# Patient Record
Sex: Female | Born: 1935 | Race: White | Hispanic: No | Smoking: Never smoker
Health system: Southern US, Community
[De-identification: ages and names within clinical notes are randomized; demographics above are authoritative.]

## PROBLEM LIST (undated history)

## (undated) DIAGNOSIS — M1990 Unspecified osteoarthritis, unspecified site: Secondary | ICD-10-CM

## (undated) DIAGNOSIS — G459 Transient cerebral ischemic attack, unspecified: Secondary | ICD-10-CM

## (undated) DIAGNOSIS — E7800 Pure hypercholesterolemia, unspecified: Secondary | ICD-10-CM

## (undated) HISTORY — DX: Transient cerebral ischemic attack, unspecified: G45.9

## (undated) HISTORY — DX: Unspecified osteoarthritis, unspecified site: M19.90

## (undated) HISTORY — PX: EYE SURGERY: SHX253

## (undated) HISTORY — PX: KNEE SURGERY: SHX244

## (undated) HISTORY — PX: APPENDECTOMY: SHX54

## (undated) HISTORY — DX: Pure hypercholesterolemia, unspecified: E78.00

---

## 2004-07-21 ENCOUNTER — Other Ambulatory Visit: Payer: Self-pay

## 2004-08-21 ENCOUNTER — Encounter: Payer: Self-pay | Admitting: Internal Medicine

## 2007-01-03 ENCOUNTER — Emergency Department: Payer: Self-pay

## 2007-01-03 ENCOUNTER — Other Ambulatory Visit: Payer: Self-pay

## 2007-01-05 ENCOUNTER — Ambulatory Visit: Payer: Self-pay | Admitting: Internal Medicine

## 2007-02-22 ENCOUNTER — Ambulatory Visit: Payer: Self-pay | Admitting: Internal Medicine

## 2007-03-14 ENCOUNTER — Emergency Department: Payer: Self-pay | Admitting: Emergency Medicine

## 2007-07-25 ENCOUNTER — Ambulatory Visit: Payer: Self-pay | Admitting: Gastroenterology

## 2007-09-19 ENCOUNTER — Ambulatory Visit: Payer: Self-pay | Admitting: Internal Medicine

## 2008-03-20 ENCOUNTER — Ambulatory Visit: Payer: Self-pay | Admitting: Internal Medicine

## 2008-09-15 ENCOUNTER — Ambulatory Visit: Payer: Self-pay | Admitting: Internal Medicine

## 2009-02-03 ENCOUNTER — Emergency Department: Payer: Self-pay | Admitting: Emergency Medicine

## 2009-09-15 ENCOUNTER — Ambulatory Visit: Payer: Self-pay | Admitting: Internal Medicine

## 2011-05-29 ENCOUNTER — Emergency Department: Payer: Self-pay | Admitting: Emergency Medicine

## 2011-06-07 ENCOUNTER — Emergency Department: Payer: Self-pay | Admitting: Emergency Medicine

## 2013-02-25 ENCOUNTER — Encounter: Payer: Self-pay | Admitting: Internal Medicine

## 2013-02-25 ENCOUNTER — Ambulatory Visit (INDEPENDENT_AMBULATORY_CARE_PROVIDER_SITE_OTHER): Payer: Medicare Other | Admitting: Internal Medicine

## 2013-02-25 VITALS — BP 132/72 | HR 90 | Temp 97.9°F | Resp 18 | Ht 62.0 in | Wt 120.2 lb

## 2013-02-25 DIAGNOSIS — G8929 Other chronic pain: Secondary | ICD-10-CM

## 2013-02-25 DIAGNOSIS — G459 Transient cerebral ischemic attack, unspecified: Secondary | ICD-10-CM

## 2013-02-25 DIAGNOSIS — R350 Frequency of micturition: Secondary | ICD-10-CM

## 2013-02-25 DIAGNOSIS — R3989 Other symptoms and signs involving the genitourinary system: Secondary | ICD-10-CM

## 2013-02-25 DIAGNOSIS — K5900 Constipation, unspecified: Secondary | ICD-10-CM

## 2013-02-25 DIAGNOSIS — R82998 Other abnormal findings in urine: Secondary | ICD-10-CM

## 2013-02-25 DIAGNOSIS — M549 Dorsalgia, unspecified: Secondary | ICD-10-CM

## 2013-02-25 DIAGNOSIS — E7800 Pure hypercholesterolemia, unspecified: Secondary | ICD-10-CM

## 2013-02-26 ENCOUNTER — Ambulatory Visit: Payer: Medicare Other

## 2013-02-26 DIAGNOSIS — R350 Frequency of micturition: Secondary | ICD-10-CM

## 2013-02-26 LAB — URINALYSIS, ROUTINE W REFLEX MICROSCOPIC
Bilirubin Urine: NEGATIVE
Total Protein, Urine: NEGATIVE
Urine Glucose: NEGATIVE

## 2013-02-27 LAB — URINE CULTURE

## 2013-02-28 ENCOUNTER — Other Ambulatory Visit: Payer: Self-pay | Admitting: Internal Medicine

## 2013-02-28 DIAGNOSIS — R319 Hematuria, unspecified: Secondary | ICD-10-CM

## 2013-02-28 NOTE — Progress Notes (Signed)
Orders placed for f/u labs.  

## 2013-03-04 ENCOUNTER — Telehealth: Payer: Self-pay | Admitting: Internal Medicine

## 2013-03-04 ENCOUNTER — Encounter: Payer: Self-pay | Admitting: Internal Medicine

## 2013-03-04 DIAGNOSIS — M549 Dorsalgia, unspecified: Secondary | ICD-10-CM | POA: Insufficient documentation

## 2013-03-04 DIAGNOSIS — G459 Transient cerebral ischemic attack, unspecified: Secondary | ICD-10-CM | POA: Insufficient documentation

## 2013-03-04 DIAGNOSIS — E7800 Pure hypercholesterolemia, unspecified: Secondary | ICD-10-CM | POA: Insufficient documentation

## 2013-03-04 NOTE — Assessment & Plan Note (Signed)
On plavix.  No reoccurring symptoms.  Follow. Obtain records.

## 2013-03-04 NOTE — Assessment & Plan Note (Signed)
Takes tramadol.  Obtain records.  Stable.

## 2013-03-04 NOTE — Progress Notes (Signed)
Subjective:    Patient ID: Tricia Ruiz, female    DOB: [birth year], 79 y.o.   MRN: [id number]  HPI 79 year old female with past history of hypercholesterolemia and TIA who comes in today to establish care.  She has previously been seeing Dr Dareen Piano.  She informs me that she lives with her daughter-n-law (Vickie).  Her daughter lives in the basement of her house as well.  States she tries to stay active.  Has had some issues with back pain.  Takes tramadol prn.  Has not pain with sitting.  Some constipation. Dulcolax works.  No increased sinus or allergy symptoms.  No cardiac symptoms with increased activity or exertion.  Her main complaint is that of some puffiness and swelling in the right side of her vagina.  Itches.  No vaginal discharge.  No abdomina pain.  Has noticed her urine is more brown.     Past Medical History  Diagnosis Date  . Arthritis   . Hypercholesterolemia   . TIA (transient ischemic attack)     Outpatient Encounter Prescriptions as of 02/25/2013  Medication Sig Dispense Refill  . clopidogrel (PLAVIX) 75 MG tablet Take 75 mg by mouth daily.      . simvastatin (ZOCOR) 40 MG tablet Take 40 mg by mouth daily.      . traMADol (ULTRAM) 50 MG tablet Take 50 mg by mouth every 6 (six) hours as needed for pain.       No facility-administered encounter medications on file as of 02/25/2013.    Review of Systems Patient denies any headache, lightheadedness or dizziness.  No sinus or allergy symptoms.  No chest pain, tightness or palpitations.  No increased shortness of breath, cough or congestion.  No acid reflux.  No nausea or vomiting.  No abdominal pain or cramping.  No bowel change, such as diarrhea, BRBPR or melana.  Some issues with constipation.  Dulcolax works for her.  Does report the vaginal burning and swelling as outlined.  No discharge.  Urine brown.       Objective:   Physical Exam Filed Vitals:   02/25/13 1429  BP: 132/72  Pulse: 90  Temp: 97.9 F (36.6 C)   Resp: 76   79 year old female in no acute distress.   HEENT:  Nares- clear.  Oropharynx - without lesions. NECK:  Supple.  Nontender.  No audible bruit.  HEART:  Appears to be regular. LUNGS:  No crackles or wheezing audible.  Respirations even and unlabored.  RADIAL PULSE:  Equal bilaterally. ABDOMEN:  Soft, nontender.  Bowel sounds present and normal.  No audible abdominal bruit.  GU:  Normal external genitalia except for some increased erythema and minimal soft tissue swelling right labia.  Appears to be c/w yeast infection.   EXTREMITIES:  No increased edema present.  DP pulses palpable and equal bilaterally.      SKIN:  Without rash.      Assessment & Plan:  GYN.  Vaginal swelling, rash and itching as outlined.  Exam as outlined.  Treat with nystatin cream as directed.  Follow.  Get her back in soon to reassess.    CONSTIPATION.  Dulcolax works.  Follow.    INCREASED PSYCHOSOCIAL STRESSORS.  Discussed some stress with her living situation.  Daughter lives in her basement.  Some increased stress related to this.  Feels she is handling things relatively well.  Follow.    GU.  With the notice of change in color of her urine, will  check urinalysis to confirm no infection.  Stay hydrated.   HEALTH MAINTENANCE.  Schedule her for a physical - next visit.  Obtain outside records to review.   I spent 45 minutes with this patient and more than 50% of the time was spent in consultation regarding the above.

## 2013-03-04 NOTE — Telephone Encounter (Signed)
Was supposed to have scheduled labs prior to leaving.  Did not get scheduled.  Please schedule labs within one week.  Thanks.

## 2013-03-04 NOTE — Assessment & Plan Note (Signed)
On simvastatin.  Check lipid panel and liver function.   

## 2013-03-05 NOTE — Telephone Encounter (Signed)
Appointment 4/16 pt aware

## 2013-03-06 ENCOUNTER — Other Ambulatory Visit (INDEPENDENT_AMBULATORY_CARE_PROVIDER_SITE_OTHER): Payer: Medicare Other

## 2013-03-06 DIAGNOSIS — R319 Hematuria, unspecified: Secondary | ICD-10-CM

## 2013-03-06 DIAGNOSIS — G459 Transient cerebral ischemic attack, unspecified: Secondary | ICD-10-CM

## 2013-03-06 DIAGNOSIS — K5900 Constipation, unspecified: Secondary | ICD-10-CM

## 2013-03-06 DIAGNOSIS — E7800 Pure hypercholesterolemia, unspecified: Secondary | ICD-10-CM

## 2013-03-06 LAB — COMPREHENSIVE METABOLIC PANEL
ALT: 13 U/L (ref 0–35)
AST: 21 U/L (ref 0–37)
Alkaline Phosphatase: 34 U/L — ABNORMAL LOW (ref 39–117)
Glucose, Bld: 101 mg/dL — ABNORMAL HIGH (ref 70–99)
Sodium: 138 mEq/L (ref 135–145)
Total Bilirubin: 0.6 mg/dL (ref 0.3–1.2)
Total Protein: 6.9 g/dL (ref 6.0–8.3)

## 2013-03-06 LAB — CBC WITH DIFFERENTIAL/PLATELET
Basophils Relative: 0.2 % (ref 0.0–3.0)
Eosinophils Relative: 3.3 % (ref 0.0–5.0)
HCT: 38.2 % (ref 36.0–46.0)
Lymphs Abs: 1.8 10*3/uL (ref 0.7–4.0)
MCV: 89 fl (ref 78.0–100.0)
Monocytes Absolute: 0.6 10*3/uL (ref 0.1–1.0)
Platelets: 240 10*3/uL (ref 150.0–400.0)
WBC: 7.2 10*3/uL (ref 4.5–10.5)

## 2013-03-06 LAB — URINALYSIS, ROUTINE W REFLEX MICROSCOPIC
Ketones, ur: NEGATIVE
Specific Gravity, Urine: 1.025 (ref 1.000–1.030)
Total Protein, Urine: NEGATIVE
Urine Glucose: NEGATIVE
pH: 5.5 (ref 5.0–8.0)

## 2013-03-06 LAB — LIPID PANEL
LDL Cholesterol: 64 mg/dL (ref 0–99)
VLDL: 27 mg/dL (ref 0.0–40.0)

## 2013-03-06 LAB — TSH: TSH: 3.71 u[IU]/mL (ref 0.35–5.50)

## 2013-03-09 LAB — CULTURE, URINE COMPREHENSIVE: Colony Count: 50000

## 2013-03-15 ENCOUNTER — Telehealth: Payer: Self-pay | Admitting: Internal Medicine

## 2013-03-15 MED ORDER — AMOXICILLIN 875 MG PO TABS: 875.0000 mg | ORAL_TABLET | Freq: Two times a day (BID) | ORAL | Status: AC

## 2013-03-15 NOTE — Telephone Encounter (Signed)
Called in Amoxicillin 875mg  bid x 7days to [facility] & pt was informed

## 2013-03-15 NOTE — Telephone Encounter (Signed)
Pt called checking on her meds for an uti  She stated she was in last week and she just checked with [facility] and the rx was not there Please advise

## 2013-04-24 ENCOUNTER — Ambulatory Visit (INDEPENDENT_AMBULATORY_CARE_PROVIDER_SITE_OTHER): Payer: Medicare Other | Admitting: Internal Medicine

## 2013-04-24 ENCOUNTER — Encounter: Payer: Self-pay | Admitting: Internal Medicine

## 2013-04-24 VITALS — BP 138/80 | HR 82 | Temp 98.5°F | Ht 62.0 in | Wt 117.5 lb

## 2013-04-24 DIAGNOSIS — R443 Hallucinations, unspecified: Secondary | ICD-10-CM

## 2013-04-24 DIAGNOSIS — M549 Dorsalgia, unspecified: Secondary | ICD-10-CM

## 2013-04-24 DIAGNOSIS — R440 Auditory hallucinations: Secondary | ICD-10-CM

## 2013-04-24 DIAGNOSIS — G8929 Other chronic pain: Secondary | ICD-10-CM

## 2013-04-24 DIAGNOSIS — E7800 Pure hypercholesterolemia, unspecified: Secondary | ICD-10-CM

## 2013-04-24 DIAGNOSIS — L989 Disorder of the skin and subcutaneous tissue, unspecified: Secondary | ICD-10-CM

## 2013-04-24 DIAGNOSIS — G459 Transient cerebral ischemic attack, unspecified: Secondary | ICD-10-CM

## 2013-04-28 ENCOUNTER — Encounter: Payer: Self-pay | Admitting: Internal Medicine

## 2013-04-28 DIAGNOSIS — R440 Auditory hallucinations: Secondary | ICD-10-CM | POA: Insufficient documentation

## 2013-04-28 NOTE — Progress Notes (Signed)
Subjective:    Patient ID: Tricia Ruiz, female    DOB: Feb 22, 1918, 79 y.o.   MRN: [id number]  HPI 79 year old female with past history of hypercholesterolemia and TIA who comes in today for a scheduled follow up.  She lives with her daughter-n-law (Tricia Ruiz).  Her daughter lives in the basement of her house as well.  Tricia Ruiz accompanies her to this visit.  History obtained from both of them.  States she tries to stay active.  Has had some issues with back pain.  Takes tramadol prn.  Has not pain with sitting.  Some constipation.  Dulcolax has worked for her previously.  Did not feel miralax helped.  No increased sinus or allergy symptoms.  No cardiac symptoms with increased activity or exertion.  She reports some persistent problems with psoriasis of the scalp.  She is using gold bond.  Has seen a dermatologist.  Agrees with referral back to dermatology.  She also has some left facial lesions for them to evaluate.  Reports some intermittent light headedness.  Occurs if she moves quickly or turn fast.  Does not occur at other times.      Past Medical History  Diagnosis Date  . Arthritis   . Hypercholesterolemia   . TIA (transient ischemic attack)     Outpatient Encounter Prescriptions as of 04/24/2013  Medication Sig Dispense Refill  . acetaminophen (TYLENOL) 325 MG tablet Take 650 mg by mouth every 6 (six) hours as needed for pain.      Marland Kitchen clopidogrel (PLAVIX) 75 MG tablet Take 75 mg by mouth daily.      . Cyanocobalamin (B-12 PO) Take by mouth.      . magnesium 30 MG tablet Take 30 mg by mouth daily.      . Pyridoxine HCl (B-6 PO) Take by mouth.      . simvastatin (ZOCOR) 40 MG tablet Take 40 mg by mouth daily.      . traMADol (ULTRAM) 50 MG tablet Take 50 mg by mouth every 6 (six) hours as needed for pain.      Marland Kitchen VITAMIN D, CHOLECALCIFEROL, PO Take by mouth.      . Zinc Sulfate (ZINC 15 PO) Take by mouth.       No facility-administered encounter medications on file as of 04/24/2013.     Review of Systems Patient denies any headache.  Light headedness as outlined.   No sinus or allergy symptoms.  No chest pain, tightness or palpitations.  No increased shortness of breath, cough or congestion.  No acid reflux.  No nausea or vomiting.  No abdominal pain or cramping.  No bowel change, such as diarrhea, BRBPR or melana.  Some issues with constipation.  Dulcolax works for her.  Previously treated for a uti.  Has psoriasis of her scalp.  Itches.  Uses Gold Bond.  Does report hearing singing when no one else hears the singing.  No visual hallucinations.  Some memory change.       Objective:   Physical Exam  Filed Vitals:   04/24/13 1031  BP: 138/80  Pulse: 82  Temp: 98.5 F (30.21 C)   79 year old female in no acute distress.   HEENT:  Nares- clear.  Oropharynx - without lesions. NECK:  Supple.  Nontender.  No audible bruit.  HEART:  Appears to be regular. LUNGS:  No crackles or wheezing audible.  Respirations even and unlabored.  RADIAL PULSE:  Equal bilaterally. ABDOMEN:  Soft, nontender.  Bowel sounds  present and normal.  No audible abdominal bruit.   EXTREMITIES:  No increased edema present.  DP pulses palpable and equal bilaterally.      SKIN:  Rash - scalp.  Facial lesions.        Assessment & Plan:  DERMATOLOGY.  Will refer to dermatology for evaluation of the facial lesions and for evaluation of her scalp.     CONSTIPATION.  Dulcolax works.  Follow.    INCREASED PSYCHOSOCIAL STRESSORS.  Previously iscussed some stress with her living situation.  Daughter lives in her basement.  Some increased stress related to this.  Feels she is handling things relatively well.  Follow.    HEALTH MAINTENANCE.  Schedule her for a physical - next visit.  Obtain outside records to review.

## 2013-04-28 NOTE — Assessment & Plan Note (Signed)
On simvastatin.  Follow lipid panel and liver function.    

## 2013-04-28 NOTE — Assessment & Plan Note (Signed)
On plavix.  No reoccurring symptoms.  Follow.      

## 2013-04-28 NOTE — Assessment & Plan Note (Signed)
Describes hearing singing.  Daughter-n-law denies hearing.  Ongoing issue.  Some memory change.  Will refer to neurology for evaluation and further treatment.  Will hold on head scan.  She prefers to see neurology first.  Follow.

## 2013-04-28 NOTE — Assessment & Plan Note (Signed)
Takes tramadol.  Stable.   

## 2013-06-04 ENCOUNTER — Other Ambulatory Visit: Payer: Self-pay | Admitting: *Deleted

## 2013-06-04 MED ORDER — TRAMADOL HCL 50 MG PO TABS: 50.0000 mg | ORAL_TABLET | Freq: Four times a day (QID) | ORAL | Status: DC | PRN

## 2013-06-04 MED ORDER — SIMVASTATIN 40 MG PO TABS: 40.0000 mg | ORAL_TABLET | Freq: Every day | ORAL | Status: DC

## 2013-06-04 MED ORDER — CLOPIDOGREL BISULFATE 75 MG PO TABS: 75.0000 mg | ORAL_TABLET | Freq: Every day | ORAL | Status: DC

## 2013-06-04 NOTE — Telephone Encounter (Signed)
Refilled tramadol #30 with no refills, plavix #30 with 5 refills and simvastatin #30 with 5 refills

## 2013-06-05 ENCOUNTER — Ambulatory Visit: Payer: Self-pay | Admitting: Neurology

## 2013-06-19 ENCOUNTER — Other Ambulatory Visit: Payer: Self-pay | Admitting: Internal Medicine

## 2013-06-19 MED ORDER — TRAMADOL HCL 50 MG PO TABS: 50.0000 mg | ORAL_TABLET | Freq: Four times a day (QID) | ORAL | Status: DC | PRN

## 2013-06-19 NOTE — Telephone Encounter (Signed)
Okay to refill? 

## 2013-06-19 NOTE — Telephone Encounter (Signed)
Refilled tramadol #60 with no refills.   

## 2013-06-19 NOTE — Telephone Encounter (Signed)
Pt is needing Tramadol refill. Pt is wanting to know if she could change to a 90 day supply.

## 2013-07-03 ENCOUNTER — Ambulatory Visit (INDEPENDENT_AMBULATORY_CARE_PROVIDER_SITE_OTHER): Payer: Medicare Other | Admitting: Internal Medicine

## 2013-07-03 ENCOUNTER — Encounter: Payer: Self-pay | Admitting: Internal Medicine

## 2013-07-03 ENCOUNTER — Ambulatory Visit (INDEPENDENT_AMBULATORY_CARE_PROVIDER_SITE_OTHER)
Admission: RE | Admit: 2013-07-03 | Discharge: 2013-07-03 | Disposition: A | Discharge status: Home / Self Care | Payer: Medicare Other | Source: Ambulatory Visit | Attending: Internal Medicine | Admitting: Internal Medicine

## 2013-07-03 VITALS — BP 130/60 | HR 80 | Temp 98.1°F | Ht 62.0 in | Wt 116.5 lb

## 2013-07-03 DIAGNOSIS — M549 Dorsalgia, unspecified: Secondary | ICD-10-CM

## 2013-07-03 DIAGNOSIS — E7800 Pure hypercholesterolemia, unspecified: Secondary | ICD-10-CM

## 2013-07-03 DIAGNOSIS — R443 Hallucinations, unspecified: Secondary | ICD-10-CM

## 2013-07-03 DIAGNOSIS — G8929 Other chronic pain: Secondary | ICD-10-CM

## 2013-07-03 DIAGNOSIS — R440 Auditory hallucinations: Secondary | ICD-10-CM

## 2013-07-03 DIAGNOSIS — G459 Transient cerebral ischemic attack, unspecified: Secondary | ICD-10-CM

## 2013-07-05 ENCOUNTER — Encounter: Payer: Self-pay | Admitting: Internal Medicine

## 2013-07-05 ENCOUNTER — Other Ambulatory Visit (INDEPENDENT_AMBULATORY_CARE_PROVIDER_SITE_OTHER): Payer: Medicare Other

## 2013-07-05 DIAGNOSIS — M549 Dorsalgia, unspecified: Secondary | ICD-10-CM

## 2013-07-05 DIAGNOSIS — E7800 Pure hypercholesterolemia, unspecified: Secondary | ICD-10-CM

## 2013-07-05 LAB — BASIC METABOLIC PANEL
BUN: 16 mg/dL (ref 6–23)
Calcium: 9.3 mg/dL (ref 8.4–10.5)
Creatinine, Ser: 0.8 mg/dL (ref 0.4–1.2)
GFR: 76.55 mL/min (ref >60.00)
Glucose, Bld: 85 mg/dL (ref 70–99)
Potassium: 4 mEq/L (ref 3.5–5.1)

## 2013-07-05 LAB — HEPATIC FUNCTION PANEL
AST: 20 U/L (ref 0–37)
Albumin: 3.9 g/dL (ref 3.5–5.2)
Total Bilirubin: 1 mg/dL (ref 0.3–1.2)

## 2013-07-05 LAB — LIPID PANEL
Cholesterol: 140 mg/dL (ref 0–200)
HDL: 55.2 mg/dL (ref >39.00)
LDL Cholesterol: 69 mg/dL (ref 0–99)
VLDL: 15.8 mg/dL (ref 0.0–40.0)

## 2013-07-05 MED ORDER — NYSTATIN 100000 UNIT/GM EX CREA: TOPICAL_CREAM | Freq: Two times a day (BID) | CUTANEOUS | Status: DC

## 2013-07-05 NOTE — Assessment & Plan Note (Signed)
On plavix.  No reoccurring symptoms.  Follow.      

## 2013-07-05 NOTE — Assessment & Plan Note (Signed)
On simvastatin.  Follow lipid panel and liver function.    

## 2013-07-05 NOTE — Assessment & Plan Note (Signed)
Takes tramadol.  Persistent pain.  Will check L-S spine xray.  Discussed the possibility of home physical therapy.

## 2013-07-05 NOTE — Assessment & Plan Note (Signed)
Evaluated by Dr Sherryll Burger.  Had EEG and MRI.  Obtain results.

## 2013-07-05 NOTE — Progress Notes (Signed)
Subjective:    Patient ID: Tricia Ruiz, female    DOB: [birth year], 79 y.o.   MRN: [id number]  HPI 79 year old female with past history of hypercholesterolemia and TIA who comes in today for a scheduled follow up.  She lives with her daughter-n-law (Vickie).  Her daughter lives in the basement of her house as well.  Vickie accompanies her to this visit.  History obtained from both of them.  States she tries to stay active.  Has had some issues with back pain.  Takes tramadol 2-3x/day.  Discussed further w/up.    No increased sinus or allergy symptoms.  No cardiac symptoms with increased activity or exertion.  Eating and drinking well.  Bowels stable.  No abdominal pain or cramping.      Past Medical History  Diagnosis Date  . Arthritis   . Hypercholesterolemia   . TIA (transient ischemic attack)     Outpatient Encounter Prescriptions as of 07/03/2013  Medication Sig Dispense Refill  . acetaminophen (TYLENOL) 325 MG tablet Take 650 mg by mouth every 6 (six) hours as needed for pain.      Marland Kitchen clopidogrel (PLAVIX) 75 MG tablet Take 1 tablet (75 mg total) by mouth daily.  30 tablet  5  . Cyanocobalamin (B-12 PO) Take by mouth.      . magnesium 30 MG tablet Take 30 mg by mouth daily.      . Pyridoxine HCl (B-6 PO) Take by mouth.      . simvastatin (ZOCOR) 40 MG tablet Take 1 tablet (40 mg total) by mouth daily.  30 tablet  5  . traMADol (ULTRAM) 50 MG tablet Take 1 tablet (50 mg total) by mouth every 6 (six) hours as needed for pain.  60 tablet  0  . VITAMIN D, CHOLECALCIFEROL, PO Take by mouth.      . Zinc Sulfate (ZINC 15 PO) Take by mouth.       No facility-administered encounter medications on file as of 07/03/2013.    Review of Systems Patient denies any headache.   No sinus or allergy symptoms.  No chest pain, tightness or palpitations.  No increased shortness of breath, cough or congestion.  No acid reflux.  No nausea or vomiting.  No abdominal pain or cramping.  No bowel change, such  as diarrhea, BRBPR or melana.  Back pain as outlined.  Takes tramadol.  Hears the singing.  See last note for details.  She Dr Margaretmary Eddy note for details.  Had EEG and MRI.  Need results.  Reports perivaginal irritation.      Objective:   Physical Exam  Filed Vitals:   07/03/13 1108  BP: 130/60  Pulse: 80  Temp: 98.1 F (77.12 C)   79 year old female in no acute distress.   HEENT:  Nares- clear.  Oropharynx - without lesions. NECK:  Supple.  Nontender.  No audible bruit.  HEART:  Appears to be regular. LUNGS:  No crackles or wheezing audible.  Respirations even and unlabored.  RADIAL PULSE:  Equal bilaterally. BREASTS:  No nipple discharge or nipple retraction present.  Could not appreciate any distinct nodules or axillary adenopathy..   ABDOMEN:  Soft, nontender.  Bowel sounds present and normal.  No audible abdominal bruit.  GU:  Increased erythema - perivaginal area.    EXTREMITIES:  No increased edema present.  DP pulses palpable and equal bilaterally.           Assessment & Plan:  DERMATOLOGY.  Perivaginal  irritation.  Keep area as dry as possible.  Nystatin cream.   Follow.   CONSTIPATION.  Dulcolax works.  Follow.    INCREASED PSYCHOSOCIAL STRESSORS.  Previously iscussed some stress with her living situation.  Daughter lives in her basement.  Some increased stress related to this.  Feels she is handling things relatively well.  Follow.    HEALTH MAINTENANCE.  Physical today.  Declines mammogram.

## 2013-07-08 ENCOUNTER — Encounter: Payer: Self-pay | Admitting: *Deleted

## 2013-07-12 ENCOUNTER — Other Ambulatory Visit: Payer: Self-pay | Admitting: *Deleted

## 2013-07-12 ENCOUNTER — Encounter: Payer: Medicare Other | Admitting: Internal Medicine

## 2013-07-12 NOTE — Telephone Encounter (Signed)
Okay to fill? 

## 2013-07-14 MED ORDER — TRAMADOL HCL 50 MG PO TABS: 50.0000 mg | ORAL_TABLET | Freq: Four times a day (QID) | ORAL | Status: DC | PRN

## 2013-07-14 NOTE — Telephone Encounter (Signed)
Refilled tramodol #90 with one refill

## 2013-07-15 ENCOUNTER — Encounter: Payer: Self-pay | Admitting: Internal Medicine

## 2013-08-29 ENCOUNTER — Other Ambulatory Visit: Payer: Self-pay | Admitting: *Deleted

## 2013-08-29 NOTE — Telephone Encounter (Signed)
Refill

## 2013-08-30 MED ORDER — TRAMADOL HCL 50 MG PO TABS: 50.0000 mg | ORAL_TABLET | Freq: Four times a day (QID) | ORAL | Status: DC | PRN

## 2013-08-30 NOTE — Telephone Encounter (Signed)
Rx faxed to [facility] 

## 2013-08-30 NOTE — Telephone Encounter (Signed)
Refilled tramadol #90 with one refill.

## 2013-11-06 ENCOUNTER — Ambulatory Visit (INDEPENDENT_AMBULATORY_CARE_PROVIDER_SITE_OTHER): Payer: Medicare Other | Admitting: Internal Medicine

## 2013-11-06 ENCOUNTER — Other Ambulatory Visit: Payer: Self-pay | Admitting: Internal Medicine

## 2013-11-06 ENCOUNTER — Encounter: Payer: Self-pay | Admitting: Internal Medicine

## 2013-11-06 VITALS — BP 110/70 | HR 74 | Temp 97.8°F | Ht 62.0 in | Wt 119.5 lb

## 2013-11-06 DIAGNOSIS — R443 Hallucinations, unspecified: Secondary | ICD-10-CM

## 2013-11-06 DIAGNOSIS — R440 Auditory hallucinations: Secondary | ICD-10-CM

## 2013-11-06 DIAGNOSIS — E7800 Pure hypercholesterolemia, unspecified: Secondary | ICD-10-CM

## 2013-11-06 DIAGNOSIS — M549 Dorsalgia, unspecified: Secondary | ICD-10-CM

## 2013-11-06 DIAGNOSIS — G8929 Other chronic pain: Secondary | ICD-10-CM

## 2013-11-06 DIAGNOSIS — G459 Transient cerebral ischemic attack, unspecified: Secondary | ICD-10-CM

## 2013-11-06 NOTE — Progress Notes (Signed)
Subjective:    Patient ID: Tricia Ruiz, female    DOB: 1918-08-24, 79 y.o.   MRN: [id number]  HPI 79 year old female with past history of hypercholesterolemia and TIA who comes in today for a scheduled follow up.  She is accompanied by her daughter.  History obtained from both of them.  She lives with her daughter.  Her daughter lives in the basement of her house.   States she tries to stay active.  Has had some issues with back pain.  Takes tramadol 2-3x/day.  Seeing Dr Gavin Potters.  Xray revealed compression fracture.  On Fosamax now.  Discussed proper way to take this medication.   No increased sinus or allergy symptoms.  No cardiac symptoms with increased activity or exertion.  Eating and drinking well.  Bowels stable.  No abdominal pain or cramping.      Past Medical History  Diagnosis Date  . Arthritis   . Hypercholesterolemia   . TIA (transient ischemic attack)     Outpatient Encounter Prescriptions as of 11/06/2013  Medication Sig  . aspirin EC 81 MG tablet Take 81 mg by mouth daily.  . Cyanocobalamin (B-12 PO) Take by mouth.  . magnesium 30 MG tablet Take 30 mg by mouth daily.  . naproxen sodium (ANAPROX) 220 MG tablet Take 220 mg by mouth daily.  Marland Kitchen nystatin cream (MYCOSTATIN) Apply topically 2 (two) times daily.  . Pyridoxine HCl (B-6 PO) Take by mouth.  . simvastatin (ZOCOR) 40 MG tablet Take 1 tablet (40 mg total) by mouth daily.  . traMADol (ULTRAM) 50 MG tablet Take 1 tablet (50 mg total) by mouth every 6 (six) hours as needed for pain.  Marland Kitchen VITAMIN D, CHOLECALCIFEROL, PO Take by mouth.  . Zinc Sulfate (ZINC 15 PO) Take by mouth.  . [DISCONTINUED] acetaminophen (TYLENOL) 325 MG tablet Take 650 mg by mouth every 6 (six) hours as needed for pain.  . [DISCONTINUED] clopidogrel (PLAVIX) 75 MG tablet Take 1 tablet (75 mg total) by mouth daily.    Review of Systems Patient denies any headache.   No sinus or allergy symptoms.  No chest pain, tightness or palpitations.  No  increased shortness of breath, cough or congestion.  No acid reflux.  No nausea or vomiting.  No abdominal pain or cramping.  No bowel change, such as diarrhea, BRBPR or melana.  Back pain as outlined.  Takes tramadol.  Has compression fracture. On fosamax now.  Hears the singing.  See previous notes for details.  She Dr Margaretmary Eddy note for details.  Had EEG and MRI.  Overall she feels things are stable.       Objective:   Physical Exam  Filed Vitals:   11/06/13 1041  BP: 110/70  Pulse: 74  Temp: 97.8 F (36.6 C)   Blood pressure recheck:  31/20  79 year old female in no acute distress.   HEENT:  Nares- clear.  Oropharynx - without lesions. NECK:  Supple.  Nontender.  No audible bruit.  HEART:  Appears to be regular. LUNGS:  No crackles or wheezing audible.  Respirations even and unlabored.  RADIAL PULSE:  Equal bilaterally.   ABDOMEN:  Soft, nontender.  Bowel sounds present and normal.  No audible abdominal bruit.    EXTREMITIES:  No increased edema present.  DP pulses palpable and equal bilaterally.           Assessment & Plan:  CONSTIPATION.  Dulcolax works.  Follow.    INCREASED PSYCHOSOCIAL STRESSORS.  Previously  discussed some stress with her living situation.  Daughter lives in her basement.  Some increased stress related to this.  Feels she is handling things relatively well.  Follow.    HEALTH MAINTENANCE.  Physical 07/03/13.  Declines mammogram.    I spent 25 minutes with the patient and more than 50% of the time was spent in consultation regarding the above.

## 2013-11-06 NOTE — Assessment & Plan Note (Signed)
On simvastatin.  Follow lipid panel and liver function.    

## 2013-11-06 NOTE — Progress Notes (Signed)
Pre-visit discussion using our clinic review tool. No additional management support is needed unless otherwise documented below in the visit note.  

## 2013-11-06 NOTE — Assessment & Plan Note (Addendum)
Evaluated by Dr Shah.  Had EEG and MRI.  States stable.  Actually does not hear the singing as often.  Follow.   

## 2013-11-06 NOTE — Assessment & Plan Note (Signed)
On plavix.  No reoccurring symptoms.  Follow.      

## 2013-11-06 NOTE — Progress Notes (Signed)
Orders placed for f/u labs.  

## 2013-11-06 NOTE — Assessment & Plan Note (Addendum)
Takes tramadol.  Persistent pain.  Seeing Dr Gavin Potters.  Has a compression fracture.  Currently pain seems to be under control.  On fosamax.  Started by Dr Gavin Potters.  Discussed proper way to take the medication.  Follow.

## 2013-11-10 ENCOUNTER — Encounter: Payer: Self-pay | Admitting: Internal Medicine

## 2014-01-02 ENCOUNTER — Other Ambulatory Visit: Payer: Self-pay | Admitting: *Deleted

## 2014-01-02 NOTE — Telephone Encounter (Signed)
Ok refill? 

## 2014-01-03 MED ORDER — TRAMADOL HCL 50 MG PO TABS
50.0000 mg | ORAL_TABLET | Freq: Four times a day (QID) | ORAL | Status: DC | PRN
Start: ? — End: 2014-03-25

## 2014-01-03 NOTE — Telephone Encounter (Signed)
Tramadol #90 with one refill - sent in

## 2014-03-12 ENCOUNTER — Encounter: Payer: Self-pay | Admitting: Internal Medicine

## 2014-03-12 ENCOUNTER — Ambulatory Visit (INDEPENDENT_AMBULATORY_CARE_PROVIDER_SITE_OTHER): Payer: Medicare HMO | Admitting: Internal Medicine

## 2014-03-12 VITALS — BP 120/70 | HR 76 | Temp 98.2°F | Ht 62.0 in | Wt 118.5 lb

## 2014-03-12 DIAGNOSIS — T148XXA Other injury of unspecified body region, initial encounter: Secondary | ICD-10-CM

## 2014-03-12 DIAGNOSIS — G459 Transient cerebral ischemic attack, unspecified: Secondary | ICD-10-CM

## 2014-03-12 DIAGNOSIS — R440 Auditory hallucinations: Secondary | ICD-10-CM

## 2014-03-12 DIAGNOSIS — E7800 Pure hypercholesterolemia, unspecified: Secondary | ICD-10-CM

## 2014-03-12 DIAGNOSIS — R21 Rash and other nonspecific skin eruption: Secondary | ICD-10-CM

## 2014-03-12 DIAGNOSIS — G8929 Other chronic pain: Secondary | ICD-10-CM

## 2014-03-12 DIAGNOSIS — R319 Hematuria, unspecified: Secondary | ICD-10-CM

## 2014-03-12 DIAGNOSIS — R443 Hallucinations, unspecified: Secondary | ICD-10-CM

## 2014-03-12 DIAGNOSIS — B351 Tinea unguium: Secondary | ICD-10-CM

## 2014-03-12 DIAGNOSIS — IMO0002 Reserved for concepts with insufficient information to code with codable children: Secondary | ICD-10-CM

## 2014-03-12 DIAGNOSIS — M549 Dorsalgia, unspecified: Secondary | ICD-10-CM

## 2014-03-12 NOTE — Progress Notes (Signed)
Subjective:    Patient ID: Tricia Ruiz, female    DOB: 08/22/1918, 80 y.o.   MRN: [id number]  HPI 80 year old female with past history of hypercholesterolemia and TIA who comes in today for a scheduled follow up.  She is accompanied by her daughter.  History obtained from both of them.  She lives with her daughter.  Her daughter lives in the basement of her house.   States she tries to stay active.  Has had some issues with back pain.  Takes tramadol 2-3x/day.  [facility].  Xray revealed compression fracture.  On Fosamax now.  Discussed proper way to take this medication.   She reports some increased problems with pain and states this limits her activity.  Has to sit and rest if up for a while.  No increased sinus or allergy symptoms.  No cardiac symptoms with increased activity or exertion.  Eating and drinking well.  Bowels stable.  No abdominal pain or cramping.  Has some persistent vaginal irritation and rash. Also has a rash on her wrist.  Would like to see Dr Cheree DittoGraham again.  Has issues with her toenails as well.  Thickened.     Past Medical History  Diagnosis Date  . Arthritis   . Hypercholesterolemia   . TIA (transient ischemic attack)     Outpatient Encounter Prescriptions as of 03/12/2014  Medication Sig  . alendronate (FOSAMAX) 70 MG tablet Take 70 mg by mouth once a week. Take with a full glass of water on an empty stomach.  Marland Kitchen. aspirin EC 81 MG tablet Take 81 mg by mouth daily.  . Cyanocobalamin (B-12 PO) Take by mouth.  . magnesium 30 MG tablet Take 30 mg by mouth daily.  Marland Kitchen. nystatin cream (MYCOSTATIN) Apply topically 2 (two) times daily.  . simvastatin (ZOCOR) 40 MG tablet Take 1 tablet (40 mg total) by mouth daily.  . traMADol (ULTRAM) 50 MG tablet Take 1 tablet (50 mg total) by mouth every 6 (six) hours as needed.  Marland Kitchen. VITAMIN D, CHOLECALCIFEROL, PO Take by mouth.  . Zinc Sulfate (ZINC 15 PO) Take by mouth.  . [DISCONTINUED] lidocaine (LIDODERM) 5 % Place 1 patch onto  the skin daily. Remove & Discard patch within 12 hours or as directed by MD  . [DISCONTINUED] naproxen sodium (ANAPROX) 220 MG tablet Take 220 mg by mouth daily.  . [DISCONTINUED] Pyridoxine HCl (B-6 PO) Take by mouth.  . [DISCONTINUED] Vitamin D, Ergocalciferol, (DRISDOL) 50000 UNITS CAPS capsule Take 50,000 Units by mouth every 7 (seven) days.    Review of Systems Patient denies any headache.   No sinus or allergy symptoms.  No chest pain, tightness or palpitations.  No increased shortness of breath, cough or congestion.  No acid reflux.  No nausea or vomiting.  No abdominal pain or cramping.  No bowel change, such as diarrhea, BRBPR or melana.  Back pain as outlined.  Takes tramadol.  Has compression fracture. On fosamax now.  Hears the singing.  See previous notes for details.  She Dr Margaretmary EddyShah's note for details.  Had EEG and MRI.  Not as often.  Overall she feels things are stable.       Objective:   Physical Exam  Filed Vitals:   03/12/14 1116  BP: 120/70  Pulse: 76  Temp: 98.2 F (1236.548 C)   80 year old female in no acute distress.   HEENT:  Nares- clear.  Oropharynx - without lesions. NECK:  Supple.  Nontender.  No audible bruit.  HEART:  Appears to be regular. LUNGS:  No crackles or wheezing audible.  Respirations even and unlabored.  RADIAL PULSE:  Equal bilaterally.   ABDOMEN:  Soft, nontender.  Bowel sounds present and normal.  No audible abdominal bruit. GU:  She declined.      EXTREMITIES:  No increased edema present.  DP pulses palpable and equal bilaterally.      FEET:  Toenails thickened.  No increased erythema.       Assessment & Plan:  CONSTIPATION.  Dulcolax works.  Follow.    INCREASED PSYCHOSOCIAL STRESSORS.  Previously discussed some stress with her living situation.  Daughter lives in her basement.  Some increased stress related to this.  Feels she is handling things relatively well.  Follow.    HEALTH MAINTENANCE.  Physical 07/03/13.  Declines mammogram.    I  spent 25 minutes with the patient and more than 50% of the time was spent in consultation regarding the above.

## 2014-03-12 NOTE — Progress Notes (Signed)
Pre visit review using our clinic review tool, if applicable. No additional management support is needed unless otherwise documented below in the visit note. 

## 2014-03-12 NOTE — Assessment & Plan Note (Signed)
Evaluated by Dr Sherryll BurgerShah.  Had EEG and MRI.  States stable.  Actually does not hear the singing as often.  Follow.

## 2014-03-12 NOTE — Assessment & Plan Note (Addendum)
Takes tramadol.  Persistent pain.  [facility].  Has a compression fracture.  Currently pain seems to be under control.  On fosamax.  [facility] by Dr [facility].  Discussed proper way to take the medication.  Follow.  Pain limiting her activity.  Has to stop and rest if up for a while.  Will refer to Dr Yves Dillhasnis for further evaluation and treatment.

## 2014-03-13 ENCOUNTER — Telehealth: Payer: Self-pay | Admitting: Internal Medicine

## 2014-03-13 NOTE — Telephone Encounter (Signed)
Requesting Provider - Tricia Ruiz  Treating Provider -Tricia Ruiz  Place of Service - Physician's Office   Procedure - [phone]- Unlisted [facility] services  Visits- 4  Start Date - 4.24.15  Expires on Date -7.23.15  Diagnosis- 110.1 Dermatophytosis of nail

## 2014-03-14 DIAGNOSIS — R21 Rash and other nonspecific skin eruption: Secondary | ICD-10-CM | POA: Insufficient documentation

## 2014-03-14 DIAGNOSIS — B351 Tinea unguium: Secondary | ICD-10-CM | POA: Insufficient documentation

## 2014-03-14 DIAGNOSIS — R319 Hematuria, unspecified: Secondary | ICD-10-CM | POA: Insufficient documentation

## 2014-03-14 NOTE — Assessment & Plan Note (Addendum)
On plavix.  No reoccurring symptoms.  Follow.

## 2014-03-14 NOTE — Assessment & Plan Note (Signed)
Persistent vaginal irritation.  Declined GU exam today.  Has seen Dr Cheree DittoGraham.  Request referral back for evaluation and treatment.  Also has a wrist rash.  Persistent.

## 2014-03-14 NOTE — Assessment & Plan Note (Signed)
Previous urine revealed 3-6 rbc's.  Recheck to confirm clearance.

## 2014-03-14 NOTE — Assessment & Plan Note (Signed)
Thickened toenails.  Request referral to podiatry.   ?

## 2014-03-14 NOTE — Assessment & Plan Note (Signed)
On simvastatin.  Follow lipid panel and liver function.    

## 2014-03-20 ENCOUNTER — Other Ambulatory Visit: Payer: Medicare HMO

## 2014-03-25 ENCOUNTER — Other Ambulatory Visit: Payer: Self-pay | Admitting: *Deleted

## 2014-03-25 NOTE — Telephone Encounter (Signed)
Okay to refill? Last seen on 03/12/14. Last refilled on 01/03/14 for #90.

## 2014-03-26 MED ORDER — TRAMADOL HCL 50 MG PO TABS: 50.0000 mg | ORAL_TABLET | Freq: Four times a day (QID) | ORAL | Status: DC | PRN

## 2014-03-26 NOTE — Telephone Encounter (Signed)
Refilled tramadol #90 with no refills.  rx signed and placed on your desk.

## 2014-04-24 ENCOUNTER — Ambulatory Visit: Payer: Self-pay | Admitting: Unknown Physician Specialty

## 2014-05-09 ENCOUNTER — Telehealth: Payer: Self-pay | Admitting: *Deleted

## 2014-05-09 MED ORDER — TRAMADOL HCL 50 MG PO TABS
50.0000 mg | ORAL_TABLET | Freq: Four times a day (QID) | ORAL | Status: DC | PRN
Start: 2014-05-09 — End: 2014-06-30

## 2014-05-09 NOTE — Telephone Encounter (Signed)
Refilled tramadol #90 with no refills.   

## 2014-05-09 NOTE — Telephone Encounter (Signed)
pts daughter called requesting Tramadol refill.  Last OV 4.22.15, last refill 5.5.15.  Please advise refill

## 2014-05-12 NOTE — Telephone Encounter (Signed)
Rx faxed to pharmacy  

## 2014-06-30 ENCOUNTER — Telehealth: Payer: Self-pay | Admitting: *Deleted

## 2014-06-30 MED ORDER — TRAMADOL HCL 50 MG PO TABS: 50.0000 mg | ORAL_TABLET | Freq: Four times a day (QID) | ORAL | Status: DC | PRN

## 2014-06-30 NOTE — Telephone Encounter (Signed)
Rx faxed to [facility]. 

## 2014-06-30 NOTE — Telephone Encounter (Signed)
Fax from pharmacy requesting Tramadol HCL 50mg , last refill 6.19.15, last OV 4.22.15, next OV 8.28.15.  Please advise refill

## 2014-06-30 NOTE — Telephone Encounter (Signed)
Refilled tramadol #90 with no refills.  rx signed and placed on your desk.   

## 2014-07-18 ENCOUNTER — Encounter: Payer: Medicare HMO | Admitting: Internal Medicine

## 2014-07-18 DIAGNOSIS — Z0289 Encounter for other administrative examinations: Secondary | ICD-10-CM

## 2014-08-05 ENCOUNTER — Telehealth: Payer: Self-pay | Admitting: *Deleted

## 2014-08-05 MED ORDER — TRAMADOL HCL 50 MG PO TABS: 50.0000 mg | ORAL_TABLET | Freq: Four times a day (QID) | ORAL | Status: DC | PRN

## 2014-08-05 NOTE — Telephone Encounter (Signed)
rx ok'd for tramadol #90 with one refill.  rx signed and placed on your desk.

## 2014-08-05 NOTE — Telephone Encounter (Signed)
Fax from pharmacy requesting Tramadol HCL , last refill 8.10.15, last OV 4.22.15, next OV 8.28.15. Please advise refill

## 2014-08-06 NOTE — Telephone Encounter (Signed)
Rx faxed by Glee Arvin, CMA

## 2014-08-13 ENCOUNTER — Other Ambulatory Visit: Payer: Self-pay | Admitting: *Deleted

## 2014-08-13 MED ORDER — SIMVASTATIN 40 MG PO TABS: 40.0000 mg | ORAL_TABLET | Freq: Every day | ORAL | Status: DC

## 2014-11-07 ENCOUNTER — Other Ambulatory Visit: Payer: Self-pay | Admitting: *Deleted

## 2014-11-07 MED ORDER — TRAMADOL HCL 50 MG PO TABS: 50.0000 mg | ORAL_TABLET | Freq: Four times a day (QID) | ORAL | Status: DC | PRN

## 2014-11-07 NOTE — Telephone Encounter (Signed)
Faxed to pharmacy

## 2014-11-07 NOTE — Telephone Encounter (Signed)
Last visit 03/12/14, no upcoming appt scheduled

## 2014-11-07 NOTE — Telephone Encounter (Signed)
Refilled tramadol #90 with no refills.  rx sent.

## 2014-12-26 ENCOUNTER — Telehealth: Payer: Self-pay | Admitting: *Deleted

## 2014-12-26 MED ORDER — TRAMADOL HCL 50 MG PO TABS: 50.0000 mg | ORAL_TABLET | Freq: Four times a day (QID) | ORAL | Status: DC | PRN

## 2014-12-26 NOTE — Telephone Encounter (Signed)
pts daughter called requesting a refill on Tramadol.  Last OV 4.22.15.  Please advise

## 2014-12-26 NOTE — Telephone Encounter (Signed)
Please confirm with pt how often she is taking the tramadol.  Just need to clarify before refill.

## 2014-12-26 NOTE — Telephone Encounter (Signed)
Spoke with pt she is taking it every 6 hours.

## 2014-12-26 NOTE — Telephone Encounter (Signed)
Refilled tramadol #120 with no refills.

## 2014-12-26 NOTE — Telephone Encounter (Signed)
RX FAXED.

## 2015-02-20 ENCOUNTER — Encounter: Payer: Self-pay | Admitting: Internal Medicine

## 2015-02-20 ENCOUNTER — Ambulatory Visit (INDEPENDENT_AMBULATORY_CARE_PROVIDER_SITE_OTHER): Payer: Commercial Managed Care - HMO | Admitting: Internal Medicine

## 2015-02-20 VITALS — BP 130/70 | HR 78 | Temp 98.1°F | Ht 62.0 in | Wt 118.1 lb

## 2015-02-20 DIAGNOSIS — T148 Other injury of unspecified body region: Secondary | ICD-10-CM | POA: Diagnosis not present

## 2015-02-20 DIAGNOSIS — M549 Dorsalgia, unspecified: Secondary | ICD-10-CM

## 2015-02-20 DIAGNOSIS — G8929 Other chronic pain: Secondary | ICD-10-CM

## 2015-02-20 DIAGNOSIS — R319 Hematuria, unspecified: Secondary | ICD-10-CM

## 2015-02-20 DIAGNOSIS — Z79891 Long term (current) use of opiate analgesic: Secondary | ICD-10-CM | POA: Diagnosis not present

## 2015-02-20 DIAGNOSIS — E7800 Pure hypercholesterolemia, unspecified: Secondary | ICD-10-CM

## 2015-02-20 DIAGNOSIS — E559 Vitamin D deficiency, unspecified: Secondary | ICD-10-CM | POA: Diagnosis not present

## 2015-02-20 DIAGNOSIS — R440 Auditory hallucinations: Secondary | ICD-10-CM | POA: Diagnosis not present

## 2015-02-20 DIAGNOSIS — W57XXXA Bitten or stung by nonvenomous insect and other nonvenomous arthropods, initial encounter: Secondary | ICD-10-CM

## 2015-02-20 DIAGNOSIS — IMO0002 Reserved for concepts with insufficient information to code with codable children: Secondary | ICD-10-CM

## 2015-02-20 LAB — CBC WITH DIFFERENTIAL/PLATELET
BASOS ABS: 0 10*3/uL (ref 0.0–0.1)
Basophils Relative: 0.3 % (ref 0.0–3.0)
Eosinophils Absolute: 0.3 10*3/uL (ref 0.0–0.7)
Eosinophils Relative: 3.2 % (ref 0.0–5.0)
HCT: 38.9 % (ref 36.0–46.0)
Hemoglobin: 13.2 g/dL (ref 12.0–15.0)
LYMPHS PCT: 15.8 % (ref 12.0–46.0)
Lymphs Abs: 1.6 10*3/uL (ref 0.7–4.0)
MCHC: 33.9 g/dL (ref 30.0–36.0)
MCV: 86.3 fl (ref 78.0–100.0)
MONOS PCT: 8.6 % (ref 3.0–12.0)
Monocytes Absolute: 0.9 10*3/uL (ref 0.1–1.0)
NEUTROS ABS: 7.3 10*3/uL (ref 1.4–7.7)
Neutrophils Relative %: 72.1 % (ref 43.0–77.0)
Platelets: 275 10*3/uL (ref 150.0–400.0)
RBC: 4.51 Mil/uL (ref 3.87–5.11)
RDW: 14.5 % (ref 11.5–15.5)
WBC: 10.1 10*3/uL (ref 4.0–10.5)

## 2015-02-20 LAB — COMPREHENSIVE METABOLIC PANEL
ALBUMIN: 4.2 g/dL (ref 3.5–5.2)
ALT: 8 U/L (ref 0–35)
AST: 16 U/L (ref 0–37)
Alkaline Phosphatase: 29 U/L — ABNORMAL LOW (ref 39–117)
BUN: 18 mg/dL (ref 6–23)
CALCIUM: 9.8 mg/dL (ref 8.4–10.5)
CHLORIDE: 106 meq/L (ref 96–112)
CO2: 30 meq/L (ref 19–32)
CREATININE: 0.74 mg/dL (ref 0.40–1.20)
GFR: 77.48 mL/min (ref >60.00)
Glucose, Bld: 88 mg/dL (ref 70–99)
POTASSIUM: 4.9 meq/L (ref 3.5–5.1)
Sodium: 140 mEq/L (ref 135–145)
Total Bilirubin: 0.4 mg/dL (ref 0.2–1.2)
Total Protein: 7 g/dL (ref 6.0–8.3)

## 2015-02-20 LAB — TSH: TSH: 2.75 u[IU]/mL (ref 0.35–4.50)

## 2015-02-20 MED ORDER — TRAMADOL HCL 50 MG PO TABS: 50.0000 mg | ORAL_TABLET | Freq: Four times a day (QID) | ORAL | Status: DC | PRN

## 2015-02-20 NOTE — Progress Notes (Signed)
Pre visit review using our clinic review tool, if applicable. No additional management support is needed unless otherwise documented below in the visit note. 

## 2015-02-21 LAB — VITAMIN D 25 HYDROXY (VIT D DEFICIENCY, FRACTURES): VIT D 25 HYDROXY: 17 ng/mL — AB (ref 30–100)

## 2015-02-22 ENCOUNTER — Encounter: Payer: Self-pay | Admitting: Internal Medicine

## 2015-02-22 DIAGNOSIS — W57XXXA Bitten or stung by nonvenomous insect and other nonvenomous arthropods, initial encounter: Secondary | ICD-10-CM | POA: Insufficient documentation

## 2015-02-22 NOTE — Assessment & Plan Note (Signed)
Persistent and appears to be worsening.  Continue tramadol as directed.  MRI compression fracture T11 - old and degenerative changes.  Refer to Dr Yves Dillhasnis for further evaluation and treatment.

## 2015-02-22 NOTE — Assessment & Plan Note (Signed)
With auditory hallucinations.  Has been evaluated by neurology previously.  Daughter request referral back to neurology for evaluation.

## 2015-02-22 NOTE — Assessment & Plan Note (Signed)
Low cholesterol diet

## 2015-02-22 NOTE — Assessment & Plan Note (Signed)
Tick embedded as outlined.  Attempted to remove tick.  The head embedded.  Dr Lemar LivingsByrnett evaluated and removed the embedded head.  Pt tolerated well.  Instructed to apply neosporin topically.  Notify me if problems.

## 2015-02-22 NOTE — Assessment & Plan Note (Signed)
Last urinalysis revealed no red blood cells.

## 2015-02-22 NOTE — Progress Notes (Signed)
Patient ID: Tricia DesanctisJolene C Ruiz, female   DOB: 1918/03/25, 81 y.o.   MRN: [id number]   Subjective:    Patient ID: Tricia Ruiz, female    DOB: 1918/03/25, 81 y.o.   MRN: [id number]  HPI  Patient here for a scheduled follow up.  She is accompanied by her daughter.  History obtained from both of them.  Reports increased back pain.  Takes tramadol.  Unclear how often she is actually taking the medication.  Daughter was requesting a higher dose.  We discussed seeing Dr Yves Dillhasnis.  Has seen ortho.  Has a topical gel she uses prn.  She is eating well.  No abdominal pain.  Bowels stable.  Daughter is concerned about Tricia Ruiz's memory.  Request referral back to neurology.  Has seen them previously for evaluation of hearing singing/music - in her head.   Reports tick on her left side - under her left arm.     Past Medical History  Diagnosis Date  . Arthritis   . Hypercholesterolemia   . TIA (transient ischemic attack)     Current Outpatient Prescriptions on File Prior to Visit  Medication Sig Dispense Refill  . alendronate (FOSAMAX) 70 MG tablet Take 70 mg by mouth once a week. Take with a full glass of water on an empty stomach.    Marland Kitchen. aspirin EC 81 MG tablet Take 81 mg by mouth daily.    . Calcium Carbonate-Vit D-Min (CALCIUM 1200 PO) Take by mouth daily.    . Cyanocobalamin (B-12 PO) Take by mouth.    . magnesium 30 MG tablet Take 30 mg by mouth daily.    . simvastatin (ZOCOR) 40 MG tablet Take 1 tablet (40 mg total) by mouth daily. 30 tablet 5  . VITAMIN D, CHOLECALCIFEROL, PO Take by mouth.    . Zinc Sulfate (ZINC 15 PO) Take by mouth.     No current facility-administered medications on file prior to visit.    Review of Systems  Constitutional: Negative for appetite change and unexpected weight change.  HENT: Negative for congestion and sinus pressure.   Respiratory: Negative for cough, chest tightness and shortness of breath.   Cardiovascular: Negative for chest pain, palpitations and  leg swelling.  Gastrointestinal: Negative for nausea, vomiting, abdominal pain and diarrhea.  Genitourinary: Negative for dysuria and difficulty urinating.  Musculoskeletal: Positive for back pain (chronic increased back pain. ).  Skin: Negative for color change and rash.  Neurological: Negative for dizziness, light-headedness and headaches.       Objective:    Physical Exam  Constitutional: She appears well-developed and well-nourished. No distress.  HENT:  Nose: Nose normal.  Mouth/Throat: Oropharynx is clear and moist.  Neck: Neck supple. No thyromegaly present.  Cardiovascular: Normal rate and regular rhythm.   Pulmonary/Chest: Breath sounds normal. No respiratory distress. She has no wheezes.  Abdominal: Soft. Bowel sounds are normal. There is no tenderness.  Musculoskeletal: She exhibits no edema or tenderness.  Lymphadenopathy:    She has no cervical adenopathy.  Skin: No rash noted. No erythema.  Tick - embedded left lateral side - beneath her arm.     BP 130/70 mmHg  Pulse 78  Temp(Src) 98.1 F (36.7 C) (Oral)  Ht 5\' 2"  (1.575 m)  Wt 118 lb 2 oz (53.581 kg)  BMI 21.60 kg/m2  SpO2 97% Wt Readings from Last 3 Encounters:  02/20/15 118 lb 2 oz (53.581 kg)  03/12/14 118 lb 8 oz (53.751 kg)  11/06/13 119 lb 8  oz (54.205 kg)     Lab Results  Component Value Date   WBC 10.1 02/20/2015   HGB 13.2 02/20/2015   HCT 38.9 02/20/2015   PLT 275.0 02/20/2015   GLUCOSE 88 02/20/2015   CHOL 140 07/05/2013   TRIG 79.0 07/05/2013   HDL 55.20 07/05/2013   LDLCALC 69 07/05/2013   ALT 8 02/20/2015   AST 16 02/20/2015   NA 140 02/20/2015   K 4.9 02/20/2015   CL 106 02/20/2015   CREATININE 0.74 02/20/2015   BUN 18 02/20/2015   CO2 30 02/20/2015   TSH 2.75 02/20/2015       Assessment & Plan:   Problem List Items Addressed This Visit    Auditory hallucinations    With auditory hallucinations.  Has been evaluated by neurology previously.  Daughter request referral  back to neurology for evaluation.        Relevant Orders   Ambulatory referral to Neurology   Chronic back pain - Primary    Persistent and appears to be worsening.  Continue tramadol as directed.  MRI compression fracture T11 - old and degenerative changes.  Refer to Dr Yves Dill for further evaluation and treatment.        Relevant Medications   traMADol (ULTRAM) 50 MG tablet   Other Relevant Orders   Ambulatory referral to Orthopedic Surgery   Hematuria    Last urinalysis revealed no red blood cells.        Hypercholesterolemia    Low cholesterol diet.        Tick bite    Tick embedded as outlined.  Attempted to remove tick.  The head embedded.  Dr Lemar Livings evaluated and removed the embedded head.  Pt tolerated well.  Instructed to apply neosporin topically.  Notify me if problems.         Other Visit Diagnoses    Compression fracture          I spent 25 minutes with the patient and more than 50% of the time was spent in consultation regarding the above.     Dale [place], MD

## 2015-02-23 ENCOUNTER — Encounter: Payer: Self-pay | Admitting: *Deleted

## 2015-03-17 ENCOUNTER — Other Ambulatory Visit: Payer: Self-pay | Admitting: *Deleted

## 2015-03-17 ENCOUNTER — Telehealth: Payer: Self-pay | Admitting: Internal Medicine

## 2015-03-17 MED ORDER — TRAMADOL HCL 50 MG PO TABS: 50.0000 mg | ORAL_TABLET | Freq: Four times a day (QID) | ORAL | Status: DC | PRN

## 2015-03-17 NOTE — Telephone Encounter (Signed)
Ok to refill x 1 - tramadol.  This should get her through until see ortho.  They can take over prescrbing pain medication after that.

## 2015-03-17 NOTE — Telephone Encounter (Signed)
pts daughter called states pt has been taking Tramadol every 4 hours for pain and is requesting a refill.  States pt has filled the 4.1.16 #30 Rx and the refill for #30 on 4.13.16.  Further states Ortho appoint is on Mar 23, 2015.  Please advise refill

## 2015-03-17 NOTE — Telephone Encounter (Signed)
Pt daughter [name] called to request refill on tramadol rx. Transferred call to triage nurse.msn

## 2015-03-18 NOTE — Telephone Encounter (Signed)
Rx faxed

## 2015-04-02 ENCOUNTER — Telehealth: Payer: Self-pay

## 2015-04-02 NOTE — Telephone Encounter (Signed)
Patient's daughter is requesting a refill on patient's tramadol.  Patient had it refilled last on 4.27.16 for one refill per your request as she was to see ortho on 5.2.16.  Patient's daughter states that she is taking 3 pills a day and that 30 pills isn't lasting enough days and she is not happy having to continuously call to get refills.  She is having extreme pain.  She has a appointment with the pain doctor and Dr. Ella JubileeSing for her memory test in the next few weeks.  Please advise?

## 2015-04-02 NOTE — Telephone Encounter (Signed)
I had given her enough to last until she saw ortho with plans for them to take over pain control (that is why she only received this many pills).  I prefer to have one MD controlling her pain meds.  If they did not give her any meds or say anything about prescribing pain meds then I can give her rx until she sees the pain MD.  Just let me know.

## 2015-04-03 ENCOUNTER — Other Ambulatory Visit: Payer: Self-pay | Admitting: Internal Medicine

## 2015-04-03 ENCOUNTER — Other Ambulatory Visit: Payer: Self-pay | Admitting: *Deleted

## 2015-04-03 MED ORDER — TRAMADOL HCL 50 MG PO TABS: 50.0000 mg | ORAL_TABLET | Freq: Three times a day (TID) | ORAL | Status: DC | PRN

## 2015-04-03 NOTE — Telephone Encounter (Signed)
[facility]

## 2015-04-03 NOTE — Telephone Encounter (Signed)
Okay to refill? Please advise.  

## 2015-04-03 NOTE — Telephone Encounter (Signed)
Refilled tramadol tid prn #60 with no refills.

## 2015-04-03 NOTE — Telephone Encounter (Signed)
rx placed in your box.  ok'd refill tramadol #60 with no refills.

## 2015-04-03 NOTE — Progress Notes (Signed)
Rx faxed (duplicate message)

## 2015-04-03 NOTE — Progress Notes (Unsigned)
rx ok'd for tramadol tid prn #60 with no refills.

## 2015-04-03 NOTE — Telephone Encounter (Signed)
Spoke to WisterKathy the patient's daughter, Originally patient had a Ortho appointment scheduled for 5/2, which the daughter found out that the patient cancelled.  Daughter has rescheduled for 04/17/15 at 3pm.  Patient is currently taking up to 3 Tramadol pills daily.  If we were able to give her 54 pills that will get her to the 31st of the month with the holiday on the 30th as safe keeping.  The daughter agreed with that plan.  Please advise?

## 2015-04-17 DIAGNOSIS — M47814 Spondylosis without myelopathy or radiculopathy, thoracic region: Secondary | ICD-10-CM | POA: Diagnosis not present

## 2015-04-17 DIAGNOSIS — M5134 Other intervertebral disc degeneration, thoracic region: Secondary | ICD-10-CM | POA: Diagnosis not present

## 2015-04-30 DIAGNOSIS — G5602 Carpal tunnel syndrome, left upper limb: Secondary | ICD-10-CM | POA: Diagnosis not present

## 2015-04-30 DIAGNOSIS — G5601 Carpal tunnel syndrome, right upper limb: Secondary | ICD-10-CM | POA: Diagnosis not present

## 2015-04-30 DIAGNOSIS — R443 Hallucinations, unspecified: Secondary | ICD-10-CM | POA: Diagnosis not present

## 2015-04-30 DIAGNOSIS — G9389 Other specified disorders of brain: Secondary | ICD-10-CM | POA: Diagnosis not present

## 2015-06-09 DIAGNOSIS — Z961 Presence of intraocular lens: Secondary | ICD-10-CM | POA: Diagnosis not present

## 2015-06-17 DIAGNOSIS — H26491 Other secondary cataract, right eye: Secondary | ICD-10-CM | POA: Diagnosis not present

## 2015-06-26 ENCOUNTER — Ambulatory Visit: Payer: Commercial Managed Care - HMO | Admitting: Internal Medicine

## 2015-10-19 DIAGNOSIS — M5134 Other intervertebral disc degeneration, thoracic region: Secondary | ICD-10-CM | POA: Diagnosis not present

## 2015-10-19 DIAGNOSIS — M47814 Spondylosis without myelopathy or radiculopathy, thoracic region: Secondary | ICD-10-CM | POA: Diagnosis not present

## 2015-10-30 DIAGNOSIS — F0281 Dementia in other diseases classified elsewhere with behavioral disturbance: Secondary | ICD-10-CM | POA: Diagnosis not present

## 2015-10-30 DIAGNOSIS — R443 Hallucinations, unspecified: Secondary | ICD-10-CM | POA: Diagnosis not present

## 2015-10-30 DIAGNOSIS — G301 Alzheimer's disease with late onset: Secondary | ICD-10-CM | POA: Diagnosis not present

## 2015-12-16 DIAGNOSIS — R443 Hallucinations, unspecified: Secondary | ICD-10-CM | POA: Diagnosis not present

## 2015-12-16 DIAGNOSIS — F0281 Dementia in other diseases classified elsewhere with behavioral disturbance: Secondary | ICD-10-CM | POA: Diagnosis not present

## 2015-12-16 DIAGNOSIS — G9389 Other specified disorders of brain: Secondary | ICD-10-CM | POA: Diagnosis not present

## 2015-12-16 DIAGNOSIS — G301 Alzheimer's disease with late onset: Secondary | ICD-10-CM | POA: Diagnosis not present

## 2015-12-16 DIAGNOSIS — R2689 Other abnormalities of gait and mobility: Secondary | ICD-10-CM | POA: Diagnosis not present

## 2015-12-25 DIAGNOSIS — M810 Age-related osteoporosis without current pathological fracture: Secondary | ICD-10-CM | POA: Diagnosis not present

## 2015-12-25 DIAGNOSIS — R2689 Other abnormalities of gait and mobility: Secondary | ICD-10-CM | POA: Diagnosis not present

## 2015-12-25 DIAGNOSIS — Z8673 Personal history of transient ischemic attack (TIA), and cerebral infarction without residual deficits: Secondary | ICD-10-CM | POA: Diagnosis not present

## 2015-12-25 DIAGNOSIS — H3530 Unspecified macular degeneration: Secondary | ICD-10-CM | POA: Diagnosis not present

## 2015-12-25 DIAGNOSIS — F0281 Dementia in other diseases classified elsewhere with behavioral disturbance: Secondary | ICD-10-CM | POA: Diagnosis not present

## 2015-12-25 DIAGNOSIS — Z96653 Presence of artificial knee joint, bilateral: Secondary | ICD-10-CM | POA: Diagnosis not present

## 2015-12-25 DIAGNOSIS — Z9181 History of falling: Secondary | ICD-10-CM | POA: Diagnosis not present

## 2015-12-25 DIAGNOSIS — G301 Alzheimer's disease with late onset: Secondary | ICD-10-CM | POA: Diagnosis not present

## 2015-12-25 DIAGNOSIS — M1990 Unspecified osteoarthritis, unspecified site: Secondary | ICD-10-CM | POA: Diagnosis not present

## 2015-12-28 DIAGNOSIS — M810 Age-related osteoporosis without current pathological fracture: Secondary | ICD-10-CM | POA: Diagnosis not present

## 2015-12-28 DIAGNOSIS — Z9181 History of falling: Secondary | ICD-10-CM | POA: Diagnosis not present

## 2015-12-28 DIAGNOSIS — M1990 Unspecified osteoarthritis, unspecified site: Secondary | ICD-10-CM | POA: Diagnosis not present

## 2015-12-28 DIAGNOSIS — R2689 Other abnormalities of gait and mobility: Secondary | ICD-10-CM | POA: Diagnosis not present

## 2015-12-28 DIAGNOSIS — Z96653 Presence of artificial knee joint, bilateral: Secondary | ICD-10-CM | POA: Diagnosis not present

## 2015-12-28 DIAGNOSIS — H3530 Unspecified macular degeneration: Secondary | ICD-10-CM | POA: Diagnosis not present

## 2015-12-28 DIAGNOSIS — F0281 Dementia in other diseases classified elsewhere with behavioral disturbance: Secondary | ICD-10-CM | POA: Diagnosis not present

## 2015-12-28 DIAGNOSIS — G301 Alzheimer's disease with late onset: Secondary | ICD-10-CM | POA: Diagnosis not present

## 2015-12-28 DIAGNOSIS — Z8673 Personal history of transient ischemic attack (TIA), and cerebral infarction without residual deficits: Secondary | ICD-10-CM | POA: Diagnosis not present

## 2015-12-30 DIAGNOSIS — R2689 Other abnormalities of gait and mobility: Secondary | ICD-10-CM | POA: Diagnosis not present

## 2015-12-30 DIAGNOSIS — Z96653 Presence of artificial knee joint, bilateral: Secondary | ICD-10-CM | POA: Diagnosis not present

## 2015-12-30 DIAGNOSIS — Z8673 Personal history of transient ischemic attack (TIA), and cerebral infarction without residual deficits: Secondary | ICD-10-CM | POA: Diagnosis not present

## 2015-12-30 DIAGNOSIS — H3530 Unspecified macular degeneration: Secondary | ICD-10-CM | POA: Diagnosis not present

## 2015-12-30 DIAGNOSIS — M1990 Unspecified osteoarthritis, unspecified site: Secondary | ICD-10-CM | POA: Diagnosis not present

## 2015-12-30 DIAGNOSIS — M810 Age-related osteoporosis without current pathological fracture: Secondary | ICD-10-CM | POA: Diagnosis not present

## 2015-12-30 DIAGNOSIS — Z9181 History of falling: Secondary | ICD-10-CM | POA: Diagnosis not present

## 2015-12-30 DIAGNOSIS — F0281 Dementia in other diseases classified elsewhere with behavioral disturbance: Secondary | ICD-10-CM | POA: Diagnosis not present

## 2015-12-30 DIAGNOSIS — G301 Alzheimer's disease with late onset: Secondary | ICD-10-CM | POA: Diagnosis not present

## 2016-01-05 DIAGNOSIS — R2689 Other abnormalities of gait and mobility: Secondary | ICD-10-CM | POA: Diagnosis not present

## 2016-01-05 DIAGNOSIS — Z96653 Presence of artificial knee joint, bilateral: Secondary | ICD-10-CM | POA: Diagnosis not present

## 2016-01-05 DIAGNOSIS — M1990 Unspecified osteoarthritis, unspecified site: Secondary | ICD-10-CM | POA: Diagnosis not present

## 2016-01-05 DIAGNOSIS — G301 Alzheimer's disease with late onset: Secondary | ICD-10-CM | POA: Diagnosis not present

## 2016-01-05 DIAGNOSIS — F0281 Dementia in other diseases classified elsewhere with behavioral disturbance: Secondary | ICD-10-CM | POA: Diagnosis not present

## 2016-01-05 DIAGNOSIS — M810 Age-related osteoporosis without current pathological fracture: Secondary | ICD-10-CM | POA: Diagnosis not present

## 2016-01-05 DIAGNOSIS — Z9181 History of falling: Secondary | ICD-10-CM | POA: Diagnosis not present

## 2016-01-05 DIAGNOSIS — H3530 Unspecified macular degeneration: Secondary | ICD-10-CM | POA: Diagnosis not present

## 2016-01-05 DIAGNOSIS — Z8673 Personal history of transient ischemic attack (TIA), and cerebral infarction without residual deficits: Secondary | ICD-10-CM | POA: Diagnosis not present

## 2016-01-07 DIAGNOSIS — R2689 Other abnormalities of gait and mobility: Secondary | ICD-10-CM | POA: Diagnosis not present

## 2016-01-07 DIAGNOSIS — H3530 Unspecified macular degeneration: Secondary | ICD-10-CM | POA: Diagnosis not present

## 2016-01-07 DIAGNOSIS — Z8673 Personal history of transient ischemic attack (TIA), and cerebral infarction without residual deficits: Secondary | ICD-10-CM | POA: Diagnosis not present

## 2016-01-07 DIAGNOSIS — M810 Age-related osteoporosis without current pathological fracture: Secondary | ICD-10-CM | POA: Diagnosis not present

## 2016-01-07 DIAGNOSIS — Z9181 History of falling: Secondary | ICD-10-CM | POA: Diagnosis not present

## 2016-01-07 DIAGNOSIS — G301 Alzheimer's disease with late onset: Secondary | ICD-10-CM | POA: Diagnosis not present

## 2016-01-07 DIAGNOSIS — M1990 Unspecified osteoarthritis, unspecified site: Secondary | ICD-10-CM | POA: Diagnosis not present

## 2016-01-07 DIAGNOSIS — Z96653 Presence of artificial knee joint, bilateral: Secondary | ICD-10-CM | POA: Diagnosis not present

## 2016-01-07 DIAGNOSIS — F0281 Dementia in other diseases classified elsewhere with behavioral disturbance: Secondary | ICD-10-CM | POA: Diagnosis not present

## 2016-01-12 DIAGNOSIS — M1990 Unspecified osteoarthritis, unspecified site: Secondary | ICD-10-CM | POA: Diagnosis not present

## 2016-01-12 DIAGNOSIS — F0281 Dementia in other diseases classified elsewhere with behavioral disturbance: Secondary | ICD-10-CM | POA: Diagnosis not present

## 2016-01-12 DIAGNOSIS — G301 Alzheimer's disease with late onset: Secondary | ICD-10-CM | POA: Diagnosis not present

## 2016-01-12 DIAGNOSIS — Z8673 Personal history of transient ischemic attack (TIA), and cerebral infarction without residual deficits: Secondary | ICD-10-CM | POA: Diagnosis not present

## 2016-01-12 DIAGNOSIS — R2689 Other abnormalities of gait and mobility: Secondary | ICD-10-CM | POA: Diagnosis not present

## 2016-01-12 DIAGNOSIS — M810 Age-related osteoporosis without current pathological fracture: Secondary | ICD-10-CM | POA: Diagnosis not present

## 2016-01-12 DIAGNOSIS — H3530 Unspecified macular degeneration: Secondary | ICD-10-CM | POA: Diagnosis not present

## 2016-01-12 DIAGNOSIS — Z96653 Presence of artificial knee joint, bilateral: Secondary | ICD-10-CM | POA: Diagnosis not present

## 2016-01-12 DIAGNOSIS — Z9181 History of falling: Secondary | ICD-10-CM | POA: Diagnosis not present

## 2016-01-14 DIAGNOSIS — M810 Age-related osteoporosis without current pathological fracture: Secondary | ICD-10-CM | POA: Diagnosis not present

## 2016-01-14 DIAGNOSIS — G301 Alzheimer's disease with late onset: Secondary | ICD-10-CM | POA: Diagnosis not present

## 2016-01-14 DIAGNOSIS — Z8673 Personal history of transient ischemic attack (TIA), and cerebral infarction without residual deficits: Secondary | ICD-10-CM | POA: Diagnosis not present

## 2016-01-14 DIAGNOSIS — Z9181 History of falling: Secondary | ICD-10-CM | POA: Diagnosis not present

## 2016-01-14 DIAGNOSIS — F0281 Dementia in other diseases classified elsewhere with behavioral disturbance: Secondary | ICD-10-CM | POA: Diagnosis not present

## 2016-01-14 DIAGNOSIS — Z96653 Presence of artificial knee joint, bilateral: Secondary | ICD-10-CM | POA: Diagnosis not present

## 2016-01-14 DIAGNOSIS — R2689 Other abnormalities of gait and mobility: Secondary | ICD-10-CM | POA: Diagnosis not present

## 2016-01-14 DIAGNOSIS — H3530 Unspecified macular degeneration: Secondary | ICD-10-CM | POA: Diagnosis not present

## 2016-01-14 DIAGNOSIS — M1990 Unspecified osteoarthritis, unspecified site: Secondary | ICD-10-CM | POA: Diagnosis not present

## 2016-01-21 DIAGNOSIS — M1990 Unspecified osteoarthritis, unspecified site: Secondary | ICD-10-CM | POA: Diagnosis not present

## 2016-01-21 DIAGNOSIS — F0281 Dementia in other diseases classified elsewhere with behavioral disturbance: Secondary | ICD-10-CM | POA: Diagnosis not present

## 2016-01-21 DIAGNOSIS — Z8673 Personal history of transient ischemic attack (TIA), and cerebral infarction without residual deficits: Secondary | ICD-10-CM | POA: Diagnosis not present

## 2016-01-21 DIAGNOSIS — Z9181 History of falling: Secondary | ICD-10-CM | POA: Diagnosis not present

## 2016-01-21 DIAGNOSIS — H3530 Unspecified macular degeneration: Secondary | ICD-10-CM | POA: Diagnosis not present

## 2016-01-21 DIAGNOSIS — Z96653 Presence of artificial knee joint, bilateral: Secondary | ICD-10-CM | POA: Diagnosis not present

## 2016-01-21 DIAGNOSIS — R2689 Other abnormalities of gait and mobility: Secondary | ICD-10-CM | POA: Diagnosis not present

## 2016-01-21 DIAGNOSIS — M810 Age-related osteoporosis without current pathological fracture: Secondary | ICD-10-CM | POA: Diagnosis not present

## 2016-01-21 DIAGNOSIS — G301 Alzheimer's disease with late onset: Secondary | ICD-10-CM | POA: Diagnosis not present

## 2016-02-12 ENCOUNTER — Ambulatory Visit: Payer: Self-pay

## 2016-03-10 ENCOUNTER — Telehealth: Payer: Self-pay | Admitting: Internal Medicine

## 2016-03-10 DIAGNOSIS — R413 Other amnesia: Secondary | ICD-10-CM

## 2016-03-10 DIAGNOSIS — R440 Auditory hallucinations: Secondary | ICD-10-CM

## 2016-03-10 NOTE — Telephone Encounter (Signed)
Please advise for referral thanks 

## 2016-03-10 NOTE — Telephone Encounter (Signed)
Spoke with Tricia Ruiz (one of the patient's daughters) the Patient has seen Dr. Clelia CroftShaw in the recent past , has been through 3-4 antipsychotic medications now, and mother has had a huge decrease in mental status.  Her brother is not wanting to admit to her decline.  Tricia Ruiz the daughter is the total care caregiver.   Brother is concerned that her meds are not helping except she is sleeping more, She is trying to leave the house at night (so they are having to lock the doors and hide a key), and she hearing things now (thinks she has a pet cat that sings to her). The family is wanting a second opinion from what Dr. Clelia CroftShaw has said. Requesting referral to go to [facility], (Tricia GravesMatt Ewend, Tricia Ruiz the chair of the department of neurosurgery?)    She also spoke with Tricia Ruiz (insurance,) to find out if she can get some assistance in the home as she is I mentioned before the main caregiver, but disabled herself.  She is requesting in home health care. She has found a agency that will assist them( can't remember the name, but will call back with it)  and they will contact us with paperwork that may need to be done.   Please advise

## 2016-03-10 NOTE — Telephone Encounter (Signed)
Pt's daughter, [name] called. She wants a referral for her [place] to [place]. Please call [name] at [phone].

## 2016-03-10 NOTE — Telephone Encounter (Signed)
Need reason for referral and who she wants to see.

## 2016-03-11 NOTE — Telephone Encounter (Signed)
I reviewed the message.  The pt is requesting Matt Ewend (head of neurosurgery).  Ms Tricia Ruiz needs to see a neurologist, not a neurosurgeon.  Neurologist would be the ones to see her about her issues that they are describing.  There is no surgical issue where where she would need to see a neurosurgeon (that I am aware of).  I do not mind referring, but needs to be referred to neurology.  If they have another name, let me know - or I can get her referred.

## 2016-03-11 NOTE — Telephone Encounter (Signed)
Order placed for neurology referral - Dr Alan Mulderaniel Kauffer [facility]).

## 2016-03-11 NOTE — Telephone Encounter (Signed)
When I asked about a name she said the chief of neurology, and then said if I get to pick I want the head guy.  She wasn't aware of any of their names. As long as it [facility] that is what she requested.  They had not researched names of the doctors there.

## 2016-03-11 NOTE — Telephone Encounter (Signed)
Spoke with the patient's daughter again.  She was requesting a mailing address to send the home health paperwork to .  i gave address and we should get it next week, I will have Dr. Lorin PicketScott fill it out and [facility].  thanks

## 2016-04-14 DIAGNOSIS — Z8673 Personal history of transient ischemic attack (TIA), and cerebral infarction without residual deficits: Secondary | ICD-10-CM | POA: Diagnosis not present

## 2016-04-14 DIAGNOSIS — G5601 Carpal tunnel syndrome, right upper limb: Secondary | ICD-10-CM | POA: Diagnosis not present

## 2016-04-14 DIAGNOSIS — G9389 Other specified disorders of brain: Secondary | ICD-10-CM | POA: Diagnosis not present

## 2016-04-14 DIAGNOSIS — F0281 Dementia in other diseases classified elsewhere with behavioral disturbance: Secondary | ICD-10-CM | POA: Diagnosis not present

## 2016-04-14 DIAGNOSIS — G301 Alzheimer's disease with late onset: Secondary | ICD-10-CM | POA: Diagnosis not present

## 2016-04-14 DIAGNOSIS — R443 Hallucinations, unspecified: Secondary | ICD-10-CM | POA: Diagnosis not present

## 2016-04-20 ENCOUNTER — Other Ambulatory Visit: Payer: Self-pay | Admitting: Neurology

## 2016-04-20 DIAGNOSIS — M5134 Other intervertebral disc degeneration, thoracic region: Secondary | ICD-10-CM | POA: Diagnosis not present

## 2016-04-20 DIAGNOSIS — G9389 Other specified disorders of brain: Secondary | ICD-10-CM

## 2016-04-20 DIAGNOSIS — M47814 Spondylosis without myelopathy or radiculopathy, thoracic region: Secondary | ICD-10-CM | POA: Diagnosis not present

## 2016-04-20 DIAGNOSIS — G301 Alzheimer's disease with late onset: Secondary | ICD-10-CM

## 2016-05-06 ENCOUNTER — Ambulatory Visit: Admission: RE | Admit: 2016-05-06 | Payer: Commercial Managed Care - HMO | Source: Ambulatory Visit

## 2016-06-13 ENCOUNTER — Telehealth: Payer: Self-pay | Admitting: *Deleted

## 2016-06-13 NOTE — Telephone Encounter (Signed)
Patient needs a follow up appt to discuss current needs, thanks

## 2016-06-13 NOTE — Telephone Encounter (Signed)
I do not mind her having home health, but I have not seen her since 02/2015.  Needs appt scheduled to address current issues.

## 2016-06-13 NOTE — Telephone Encounter (Signed)
Thanks

## 2016-06-13 NOTE — Telephone Encounter (Signed)
Please advise for Home health referral, thanks

## 2016-06-13 NOTE — Telephone Encounter (Signed)
Patients daughter has requested an update on her mother referral to receiving home health. She will need help with daily task.   Chip Boer [phone]

## 2016-06-23 ENCOUNTER — Encounter: Payer: Self-pay | Admitting: Internal Medicine

## 2016-06-23 ENCOUNTER — Ambulatory Visit (INDEPENDENT_AMBULATORY_CARE_PROVIDER_SITE_OTHER): Payer: Commercial Managed Care - HMO | Admitting: Internal Medicine

## 2016-06-23 VITALS — BP 120/70 | HR 86 | Temp 97.6°F | Resp 17 | Ht 62.0 in | Wt 110.0 lb

## 2016-06-23 DIAGNOSIS — R634 Abnormal weight loss: Secondary | ICD-10-CM | POA: Diagnosis not present

## 2016-06-23 DIAGNOSIS — G309 Alzheimer's disease, unspecified: Secondary | ICD-10-CM | POA: Diagnosis not present

## 2016-06-23 DIAGNOSIS — F0280 Dementia in other diseases classified elsewhere without behavioral disturbance: Secondary | ICD-10-CM

## 2016-06-23 DIAGNOSIS — E559 Vitamin D deficiency, unspecified: Secondary | ICD-10-CM

## 2016-06-23 DIAGNOSIS — M549 Dorsalgia, unspecified: Secondary | ICD-10-CM | POA: Diagnosis not present

## 2016-06-23 DIAGNOSIS — E7800 Pure hypercholesterolemia, unspecified: Secondary | ICD-10-CM | POA: Diagnosis not present

## 2016-06-23 DIAGNOSIS — R440 Auditory hallucinations: Secondary | ICD-10-CM

## 2016-06-23 DIAGNOSIS — G8929 Other chronic pain: Secondary | ICD-10-CM

## 2016-06-23 DIAGNOSIS — R0989 Other specified symptoms and signs involving the circulatory and respiratory systems: Secondary | ICD-10-CM

## 2016-06-23 LAB — LIPID PANEL
CHOL/HDL RATIO: 3
Cholesterol: 187 mg/dL (ref 0–200)
HDL: 54.2 mg/dL (ref >39.00)
LDL Cholesterol: 106 mg/dL — ABNORMAL HIGH (ref 0–99)
NONHDL: 133.28
TRIGLYCERIDES: 138 mg/dL (ref 0.0–149.0)
VLDL: 27.6 mg/dL (ref 0.0–40.0)

## 2016-06-23 LAB — CBC WITH DIFFERENTIAL/PLATELET
BASOS PCT: 0.2 % (ref 0.0–3.0)
Basophils Absolute: 0 10*3/uL (ref 0.0–0.1)
EOS PCT: 2.4 % (ref 0.0–5.0)
Eosinophils Absolute: 0.1 10*3/uL (ref 0.0–0.7)
HEMATOCRIT: 33.9 % — AB (ref 36.0–46.0)
HEMOGLOBIN: 11.1 g/dL — AB (ref 12.0–15.0)
LYMPHS PCT: 35.3 % (ref 12.0–46.0)
Lymphs Abs: 1.9 10*3/uL (ref 0.7–4.0)
MCHC: 32.7 g/dL (ref 30.0–36.0)
MCV: 90.6 fl (ref 78.0–100.0)
MONOS PCT: 10.4 % (ref 3.0–12.0)
Monocytes Absolute: 0.6 10*3/uL (ref 0.1–1.0)
Neutro Abs: 2.8 10*3/uL (ref 1.4–7.7)
Neutrophils Relative %: 51.7 % (ref 43.0–77.0)
Platelets: 283 10*3/uL (ref 150.0–400.0)
RBC: 3.74 Mil/uL — AB (ref 3.87–5.11)
RDW: 13.2 % (ref 11.5–15.5)
WBC: 5.4 10*3/uL (ref 4.0–10.5)

## 2016-06-23 LAB — VITAMIN D 25 HYDROXY (VIT D DEFICIENCY, FRACTURES): VITD: 34.3 ng/mL (ref 30.00–100.00)

## 2016-06-23 LAB — BASIC METABOLIC PANEL
BUN: 20 mg/dL (ref 6–23)
CHLORIDE: 104 meq/L (ref 96–112)
CO2: 31 meq/L (ref 19–32)
Calcium: 9.7 mg/dL (ref 8.4–10.5)
Creatinine, Ser: 0.83 mg/dL (ref 0.40–1.20)
GFR: 67.67 mL/min (ref >60.00)
GLUCOSE: 114 mg/dL — AB (ref 70–99)
POTASSIUM: 3.8 meq/L (ref 3.5–5.1)
Sodium: 140 mEq/L (ref 135–145)

## 2016-06-23 LAB — HEPATIC FUNCTION PANEL
ALK PHOS: 28 U/L — AB (ref 39–117)
ALT: 9 U/L (ref 0–35)
AST: 17 U/L (ref 0–37)
Albumin: 3.9 g/dL (ref 3.5–5.2)
BILIRUBIN DIRECT: 0.1 mg/dL (ref 0.0–0.3)
BILIRUBIN TOTAL: 0.4 mg/dL (ref 0.2–1.2)
Total Protein: 6.8 g/dL (ref 6.0–8.3)

## 2016-06-23 LAB — TSH: TSH: 3.73 u[IU]/mL (ref 0.35–4.50)

## 2016-06-23 NOTE — Progress Notes (Signed)
Pre-visit discussion using our clinic review tool. No additional management support is needed unless otherwise documented below in the visit note.  

## 2016-06-23 NOTE — Progress Notes (Signed)
Patient ID: Khylie C Dobie, femalHALLI EQUIHUA4[birth year], 82 y.o.   MRN: [id number]   Subjective:    Patient ID: Claudia Desanctis, female    DOB: 1917-11-23, 82 y.o.   MRN: [id number]  HPI  Patient here for a scheduled follow up.  She is accompanied by her daughter and roomate.  History obtained from all three of them.  Have not seen her in over one year.  Has chronic thoracic back pain.  Seeing Dr Yves Dill.  See his note.  Taking tramadol.  Daughter reports she has declined.  States she needs help with ADLs - eating, bathing, dressing.  Daughter feels need home health to assist.  Wants to try to keep her home as long as they can.  She is eating.  Daughter states - has a good appetite.  No nausea or vomiting.  Takes a stool softener to keep bowels regular.  No abdomina pain.  Has a cane.  Does not always use.     Past Medical History:  Diagnosis Date  . Arthritis   . Hypercholesterolemia   . TIA (transient ischemic attack)    Past Surgical History:  Procedure Laterality Date  . APPENDECTOMY    . EYE SURGERY    . KNEE SURGERY Bilateral    bilateral knee replacement   Family History  Problem Relation Age of Onset  . Arthritis Mother   . Heart disease Mother   . Heart disease Father    Social History   Social History  . Marital status: Unknown    Spouse name: N/A  . Number of children: N/A  . Years of education: N/A   Social History Main Topics  . Smoking status: Never Smoker  . Smokeless tobacco: Never Used  . Alcohol use No  . Drug use: No  . Sexual activity: Not Asked   Other Topics Concern  . None   Social History Narrative  . None    Outpatient Encounter Prescriptions as of 06/23/2016  Medication Sig  . alendronate (FOSAMAX) 70 MG tablet Take 70 mg by mouth once a week. Take with a full glass of water on an empty stomach.  . Calcium Carbonate-Vit D-Min (CALCIUM 1200 PO) Take by mouth daily.  . clonazePAM (KLONOPIN) 0.5 MG tablet Take 0.5 mg by mouth at bedtime.  .  Cyanocobalamin (B-12 PO) Take by mouth.  Tery Sanfilippo Calcium (STOOL SOFTENER PO) Take by mouth.  . traMADol (ULTRAM) 50 MG tablet Take 1 tablet (50 mg total) by mouth 3 (three) times daily as needed.  Marland Kitchen VITAMIN D, CHOLECALCIFEROL, PO Take by mouth.  . [DISCONTINUED] aspirin EC 81 MG tablet Take 81 mg by mouth daily.  . [DISCONTINUED] magnesium 30 MG tablet Take 30 mg by mouth daily.  . [DISCONTINUED] simvastatin (ZOCOR) 40 MG tablet Take 1 tablet (40 mg total) by mouth daily.  . [DISCONTINUED] Zinc Sulfate (ZINC 15 PO) Take by mouth.   No facility-administered encounter medications on file as of 06/23/2016.     Review of Systems  Constitutional:       Eats well - per report.  Decreased weight.   HENT: Negative for congestion and sinus pressure.   Respiratory: Negative for cough, chest tightness and shortness of breath.   Cardiovascular: Negative for chest pain, palpitations and leg swelling.  Gastrointestinal: Negative for abdominal pain, diarrhea, nausea and vomiting.       Takes stool softener.    Genitourinary: Negative for difficulty urinating and dysuria.  Musculoskeletal: Negative for  myalgias.       Chronic back pain.  Sees Dr Yves Dill.    Skin: Negative for color change and rash.  Neurological: Negative for dizziness, light-headedness and headaches.  Psychiatric/Behavioral: Positive for hallucinations. Negative for dysphoric mood.       Objective:    Physical Exam  Constitutional: She appears well-developed. No distress.  HENT:  Nose: Nose normal.  Mouth/Throat: Oropharynx is clear and moist.  Neck: Neck supple. No thyromegaly present.  Cardiovascular: Normal rate and regular rhythm.   Pulmonary/Chest: Breath sounds normal. No respiratory distress. She has no wheezes.  Abdominal: Soft. Bowel sounds are normal. There is no tenderness.  Audible abdominal bruit.   Musculoskeletal: She exhibits no edema or tenderness.  Lymphadenopathy:    She has no cervical adenopathy.    Skin: No rash noted. No erythema.  Psychiatric: She has a normal mood and affect.    BP 120/70 (BP Location: Right Arm, Patient Position: Sitting, Cuff Size: Normal)   Pulse 86   Temp 97.6 F (36.4 C) (Oral)   Resp 17   Ht 5\' 2"  (1.575 m)   Wt 110 lb (49.9 kg)   SpO2 97%   BMI 20.12 kg/m  Wt Readings from Last 3 Encounters:  06/23/16 110 lb (49.9 kg)  02/20/15 118 lb 2 oz (53.6 kg)  03/12/14 118 lb 8 oz (53.8 kg)     Lab Results  Component Value Date   WBC 5.4 06/23/2016   HGB 11.1 (L) 06/23/2016   HCT 33.9 (L) 06/23/2016   PLT 283.0 06/23/2016   GLUCOSE 114 (H) 06/23/2016   CHOL 187 06/23/2016   TRIG 138.0 06/23/2016   HDL 54.20 06/23/2016   LDLCALC 106 (H) 06/23/2016   ALT 9 06/23/2016   AST 17 06/23/2016   NA 140 06/23/2016   K 3.8 06/23/2016   CL 104 06/23/2016   CREATININE 0.83 06/23/2016   BUN 20 06/23/2016   CO2 31 06/23/2016   TSH 3.73 06/23/2016       Assessment & Plan:   Problem List Items Addressed This Visit    Abdominal bruit    Schedule an aortic ultrasound.        Relevant Orders   US Aorta   Auditory hallucinations    Has been evaluated by neurology.  Diagnosed with dementia.  Continue f/u with neurology.        Chronic back pain    Chronic back pain.  Seeing Dr Yves Dill.  See his notes.  On tramadol.        Dementia in Alzheimer's disease - Primary    Seeing Dr Sherryll Burger.  Feel has progressed.  Needing increased help with ADLs as outlined.  Medication management.  Referral for home health assessment.        Relevant Medications   clonazePAM (KLONOPIN) 0.5 MG tablet   Other Relevant Orders   Ambulatory referral to Home Health   Hypercholesterolemia    Follow lipid panel.       Relevant Orders   Lipid panel (Completed)   Loss of weight    Discussed with them today.  She is eating.  Daughter reports a good appetite.  Nutritional supplements.  Check labs.  Follow.        Relevant Orders   CBC with Differential/Platelet  (Completed)   TSH (Completed)   Hepatic function panel (Completed)   Basic metabolic panel (Completed)    Other Visit Diagnoses    Vitamin D deficiency       Relevant  Orders   VITAMIN D 25 Hydroxy (Vit-D Deficiency, Fractures) (Completed)       Dale [place], MD

## 2016-06-24 ENCOUNTER — Encounter: Payer: Self-pay | Admitting: Internal Medicine

## 2016-06-24 ENCOUNTER — Other Ambulatory Visit: Payer: Self-pay | Admitting: Internal Medicine

## 2016-06-24 DIAGNOSIS — R0989 Other specified symptoms and signs involving the circulatory and respiratory systems: Secondary | ICD-10-CM

## 2016-06-24 DIAGNOSIS — D649 Anemia, unspecified: Secondary | ICD-10-CM

## 2016-06-24 NOTE — Assessment & Plan Note (Signed)
Has been evaluated by neurology.  Diagnosed with dementia.  Continue f/u with neurology.

## 2016-06-24 NOTE — Assessment & Plan Note (Signed)
Chronic back pain.  Seeing Dr Yves Dill.  See his notes.  On tramadol.

## 2016-06-24 NOTE — Assessment & Plan Note (Signed)
Schedule an aortic ultrasound.    

## 2016-06-24 NOTE — Assessment & Plan Note (Signed)
Discussed with them today.  She is eating.  Daughter reports a good appetite.  Nutritional supplements.  Check labs.  Follow.

## 2016-06-24 NOTE — Assessment & Plan Note (Signed)
Follow lipid panel.   

## 2016-06-24 NOTE — Assessment & Plan Note (Signed)
Seeing Dr Sherryll Burger.  Feel has progressed.  Needing increased help with ADLs as outlined.  Medication management.  Referral for home health assessment.

## 2016-06-28 ENCOUNTER — Telehealth: Payer: Self-pay | Admitting: Internal Medicine

## 2016-06-28 DIAGNOSIS — F0280 Dementia in other diseases classified elsewhere without behavioral disturbance: Secondary | ICD-10-CM | POA: Diagnosis not present

## 2016-06-28 DIAGNOSIS — R634 Abnormal weight loss: Secondary | ICD-10-CM | POA: Diagnosis not present

## 2016-06-28 DIAGNOSIS — G8929 Other chronic pain: Secondary | ICD-10-CM | POA: Diagnosis not present

## 2016-06-28 DIAGNOSIS — G309 Alzheimer's disease, unspecified: Secondary | ICD-10-CM | POA: Diagnosis not present

## 2016-06-28 DIAGNOSIS — M549 Dorsalgia, unspecified: Secondary | ICD-10-CM | POA: Diagnosis not present

## 2016-06-28 NOTE — Telephone Encounter (Signed)
Notified home health nurse

## 2016-06-28 NOTE — Telephone Encounter (Signed)
Cindy called from [facility] . Per Arline Aspindy she and the family think that the patient would benefit from having a Home health aid , physical therapy and occupational therapy. The family is needing someone to help dress and bath the patient. She is also having some imbalance issues. Arline AspCindy is also needing to know if the patient is needing additional labs and if so she will need and order for that also.

## 2016-06-28 NOTE — Telephone Encounter (Signed)
Ok. Please give verbal ok for requested therapy, etc.  Also the labs that are needed are cbc, ibc panel, ferritin and B12 - diagnosis - anemia.

## 2016-06-30 DIAGNOSIS — R634 Abnormal weight loss: Secondary | ICD-10-CM | POA: Diagnosis not present

## 2016-06-30 DIAGNOSIS — G8929 Other chronic pain: Secondary | ICD-10-CM | POA: Diagnosis not present

## 2016-06-30 DIAGNOSIS — G309 Alzheimer's disease, unspecified: Secondary | ICD-10-CM | POA: Diagnosis not present

## 2016-06-30 DIAGNOSIS — M549 Dorsalgia, unspecified: Secondary | ICD-10-CM | POA: Diagnosis not present

## 2016-06-30 DIAGNOSIS — F0280 Dementia in other diseases classified elsewhere without behavioral disturbance: Secondary | ICD-10-CM | POA: Diagnosis not present

## 2016-07-01 ENCOUNTER — Telehealth: Payer: Self-pay | Admitting: *Deleted

## 2016-07-01 ENCOUNTER — Encounter: Payer: Self-pay | Admitting: Internal Medicine

## 2016-07-01 DIAGNOSIS — G309 Alzheimer's disease, unspecified: Secondary | ICD-10-CM | POA: Diagnosis not present

## 2016-07-01 DIAGNOSIS — D649 Anemia, unspecified: Secondary | ICD-10-CM | POA: Diagnosis not present

## 2016-07-01 DIAGNOSIS — G8929 Other chronic pain: Secondary | ICD-10-CM | POA: Diagnosis not present

## 2016-07-01 DIAGNOSIS — R634 Abnormal weight loss: Secondary | ICD-10-CM | POA: Diagnosis not present

## 2016-07-01 DIAGNOSIS — M549 Dorsalgia, unspecified: Secondary | ICD-10-CM | POA: Diagnosis not present

## 2016-07-01 DIAGNOSIS — F0280 Dementia in other diseases classified elsewhere without behavioral disturbance: Secondary | ICD-10-CM | POA: Diagnosis not present

## 2016-07-01 NOTE — Telephone Encounter (Signed)
Don from [facility] stated that there was a delay in occupational therapy, he would like to have a okay to evaluate pt next week. Roe Coombson [phone]

## 2016-07-01 NOTE — Telephone Encounter (Signed)
Left message for Don to call back

## 2016-07-01 NOTE — Telephone Encounter (Signed)
Roe CoombsDon has been told that would be ok.

## 2016-07-04 ENCOUNTER — Ambulatory Visit
Admission: RE | Admit: 2016-07-04 | Discharge: 2016-07-04 | Disposition: A | Discharge status: Home / Self Care | Payer: Commercial Managed Care - HMO | Source: Ambulatory Visit | Attending: Internal Medicine | Admitting: Internal Medicine

## 2016-07-04 DIAGNOSIS — I77811 Abdominal aortic ectasia: Secondary | ICD-10-CM | POA: Insufficient documentation

## 2016-07-04 DIAGNOSIS — R0989 Other specified symptoms and signs involving the circulatory and respiratory systems: Secondary | ICD-10-CM

## 2016-07-04 DIAGNOSIS — R109 Unspecified abdominal pain: Secondary | ICD-10-CM | POA: Diagnosis not present

## 2016-07-04 DIAGNOSIS — K8020 Calculus of gallbladder without cholecystitis without obstruction: Secondary | ICD-10-CM | POA: Insufficient documentation

## 2016-07-04 LAB — CBC AND DIFFERENTIAL
HCT: 35 % — AB (ref 36–46)
HEMOGLOBIN: 11.6 g/dL — AB (ref 12.0–16.0)
PLATELETS: 290 10*3/uL (ref 150–399)
WBC: 6.9 10*3/mL

## 2016-07-05 ENCOUNTER — Encounter: Payer: Self-pay | Admitting: Internal Medicine

## 2016-07-05 ENCOUNTER — Telehealth: Payer: Self-pay | Admitting: Internal Medicine

## 2016-07-05 DIAGNOSIS — R634 Abnormal weight loss: Secondary | ICD-10-CM | POA: Diagnosis not present

## 2016-07-05 DIAGNOSIS — M549 Dorsalgia, unspecified: Secondary | ICD-10-CM | POA: Diagnosis not present

## 2016-07-05 DIAGNOSIS — G309 Alzheimer's disease, unspecified: Secondary | ICD-10-CM | POA: Diagnosis not present

## 2016-07-05 DIAGNOSIS — F0280 Dementia in other diseases classified elsewhere without behavioral disturbance: Secondary | ICD-10-CM | POA: Diagnosis not present

## 2016-07-05 DIAGNOSIS — G8929 Other chronic pain: Secondary | ICD-10-CM | POA: Diagnosis not present

## 2016-07-05 NOTE — Telephone Encounter (Signed)
Does the nurse feel she needs a hospital bed.  If so, do they have documentation to reflect the need.  Thanks

## 2016-07-05 NOTE — Telephone Encounter (Signed)
Please advise and order if needed. thanks 

## 2016-07-05 NOTE — Telephone Encounter (Signed)
Cindy [phone] called from [facility] regarding pt family wants her to have a hospital bed and family preferes [facility] and a Rx is needed. Fax Rx to [facility] [phone]. On Rx it needs to say weakness lack of independent with mobility and difficulty transferring and needs dx code of those things in order for insurance to pay. Thank you!

## 2016-07-05 NOTE — Telephone Encounter (Signed)
Spoke with the nurse, Arline AspCindy, she will have the documentation in the patients record today, she states that the patient has been having moments of dizziness when getting up and she would benefit from the assist to stand with the hospital bed. thanks

## 2016-07-06 DIAGNOSIS — R634 Abnormal weight loss: Secondary | ICD-10-CM | POA: Diagnosis not present

## 2016-07-06 DIAGNOSIS — G8929 Other chronic pain: Secondary | ICD-10-CM | POA: Diagnosis not present

## 2016-07-06 DIAGNOSIS — M549 Dorsalgia, unspecified: Secondary | ICD-10-CM | POA: Diagnosis not present

## 2016-07-06 DIAGNOSIS — F0280 Dementia in other diseases classified elsewhere without behavioral disturbance: Secondary | ICD-10-CM | POA: Diagnosis not present

## 2016-07-06 DIAGNOSIS — G309 Alzheimer's disease, unspecified: Secondary | ICD-10-CM | POA: Diagnosis not present

## 2016-07-06 NOTE — Telephone Encounter (Signed)
I don't see this, please advise where it was placed. thanks

## 2016-07-06 NOTE — Telephone Encounter (Signed)
rx on your desk now.  Thanks

## 2016-07-06 NOTE — Telephone Encounter (Signed)
rx written and placed on your desk.

## 2016-07-06 NOTE — Telephone Encounter (Signed)
Faxed, thanks

## 2016-07-07 NOTE — Telephone Encounter (Signed)
Notify pts daughter that I wanted to talk to radiology about her scans - that was reason for delay.  No actual aneurysm noted.  We will follow.  They were questioning some change in the kidney.  Kidney function is ok, but need to get a better look at the kidneys.  Needs a CT scan to better evaluate.  If agreeable, let me know and I will place the order.

## 2016-07-07 NOTE — Telephone Encounter (Signed)
LMTCB

## 2016-07-07 NOTE — Telephone Encounter (Signed)
Gave family her lab results & they asked me if we have receive her US report yet from 07/04/16.

## 2016-07-08 DIAGNOSIS — G309 Alzheimer's disease, unspecified: Secondary | ICD-10-CM | POA: Diagnosis not present

## 2016-07-08 DIAGNOSIS — R634 Abnormal weight loss: Secondary | ICD-10-CM | POA: Diagnosis not present

## 2016-07-08 DIAGNOSIS — F0280 Dementia in other diseases classified elsewhere without behavioral disturbance: Secondary | ICD-10-CM | POA: Diagnosis not present

## 2016-07-08 DIAGNOSIS — G8929 Other chronic pain: Secondary | ICD-10-CM | POA: Diagnosis not present

## 2016-07-08 DIAGNOSIS — M549 Dorsalgia, unspecified: Secondary | ICD-10-CM | POA: Diagnosis not present

## 2016-07-11 DIAGNOSIS — R634 Abnormal weight loss: Secondary | ICD-10-CM | POA: Diagnosis not present

## 2016-07-11 DIAGNOSIS — G309 Alzheimer's disease, unspecified: Secondary | ICD-10-CM | POA: Diagnosis not present

## 2016-07-11 DIAGNOSIS — F0280 Dementia in other diseases classified elsewhere without behavioral disturbance: Secondary | ICD-10-CM | POA: Diagnosis not present

## 2016-07-11 DIAGNOSIS — M549 Dorsalgia, unspecified: Secondary | ICD-10-CM | POA: Diagnosis not present

## 2016-07-11 DIAGNOSIS — G8929 Other chronic pain: Secondary | ICD-10-CM | POA: Diagnosis not present

## 2016-07-12 ENCOUNTER — Telehealth: Payer: Self-pay | Admitting: Internal Medicine

## 2016-07-12 DIAGNOSIS — M549 Dorsalgia, unspecified: Secondary | ICD-10-CM | POA: Diagnosis not present

## 2016-07-12 DIAGNOSIS — G309 Alzheimer's disease, unspecified: Secondary | ICD-10-CM | POA: Diagnosis not present

## 2016-07-12 DIAGNOSIS — Z87440 Personal history of urinary (tract) infections: Secondary | ICD-10-CM

## 2016-07-12 DIAGNOSIS — Z5181 Encounter for therapeutic drug level monitoring: Secondary | ICD-10-CM

## 2016-07-12 DIAGNOSIS — Z9181 History of falling: Secondary | ICD-10-CM

## 2016-07-12 DIAGNOSIS — F0280 Dementia in other diseases classified elsewhere without behavioral disturbance: Secondary | ICD-10-CM | POA: Diagnosis not present

## 2016-07-12 DIAGNOSIS — G8929 Other chronic pain: Secondary | ICD-10-CM | POA: Diagnosis not present

## 2016-07-12 DIAGNOSIS — R634 Abnormal weight loss: Secondary | ICD-10-CM

## 2016-07-12 NOTE — Telephone Encounter (Signed)
Spoke to Rhonda Perrish at [facility],  She stated that she will have a nurse call us here to let us know if patient will be ok with the CT Scan. 

## 2016-07-12 NOTE — Telephone Encounter (Signed)
Spoke to Pathmark Storeshonda Perrish at [facility],  She stated that she will have a nurse call us here to let us know if patient will be ok with the CT Scan.

## 2016-07-12 NOTE — Telephone Encounter (Signed)
Tricia Ruiz [phone] [facility] regarding wanting to know if pt needs any labs? Please advise? Thank you!

## 2016-07-13 ENCOUNTER — Telehealth: Payer: Self-pay | Admitting: Internal Medicine

## 2016-07-13 DIAGNOSIS — F0280 Dementia in other diseases classified elsewhere without behavioral disturbance: Secondary | ICD-10-CM | POA: Diagnosis not present

## 2016-07-13 DIAGNOSIS — N1330 Unspecified hydronephrosis: Secondary | ICD-10-CM

## 2016-07-13 DIAGNOSIS — G309 Alzheimer's disease, unspecified: Secondary | ICD-10-CM | POA: Diagnosis not present

## 2016-07-13 DIAGNOSIS — R634 Abnormal weight loss: Secondary | ICD-10-CM | POA: Diagnosis not present

## 2016-07-13 DIAGNOSIS — M549 Dorsalgia, unspecified: Secondary | ICD-10-CM | POA: Diagnosis not present

## 2016-07-13 DIAGNOSIS — G8929 Other chronic pain: Secondary | ICD-10-CM | POA: Diagnosis not present

## 2016-07-13 NOTE — Telephone Encounter (Signed)
Left message for Michiel CowboyVick Clabo to call back to discuss Ultrasound results & recommendations since we have not heard back from [facility]. I'm not sure if family is aware of results.

## 2016-07-13 NOTE — Telephone Encounter (Signed)
Spoke to Tricia Ruiz,  Patient has not had a CT scan but she did have US. She was informed of results.

## 2016-07-13 NOTE — Telephone Encounter (Signed)
Cindy [phone] called [facility] regarding pt had a CT on 07/05/16. Arline AspCindy is calling to get CT results. Thank you1

## 2016-07-14 DIAGNOSIS — G309 Alzheimer's disease, unspecified: Secondary | ICD-10-CM | POA: Diagnosis not present

## 2016-07-14 DIAGNOSIS — F0280 Dementia in other diseases classified elsewhere without behavioral disturbance: Secondary | ICD-10-CM | POA: Diagnosis not present

## 2016-07-14 DIAGNOSIS — M549 Dorsalgia, unspecified: Secondary | ICD-10-CM | POA: Diagnosis not present

## 2016-07-14 DIAGNOSIS — R634 Abnormal weight loss: Secondary | ICD-10-CM | POA: Diagnosis not present

## 2016-07-14 DIAGNOSIS — G8929 Other chronic pain: Secondary | ICD-10-CM | POA: Diagnosis not present

## 2016-07-15 NOTE — Telephone Encounter (Signed)
Left message for patient to return call.  Patient needs to be notified that CT has been ordered and someone will call to schedule appointment.

## 2016-07-15 NOTE — Telephone Encounter (Signed)
Order placed for CT scan.   Someone should be contacting with appt date and time.

## 2016-07-15 NOTE — Telephone Encounter (Signed)
Please advise 

## 2016-07-15 NOTE — Telephone Encounter (Signed)
[facility], requesting to have a CT scan ordered due to having swelling of the Kidney and having gall stones.  Please Contact Tricia MessierKathy [phone]

## 2016-07-18 ENCOUNTER — Telehealth: Payer: Self-pay | Admitting: Internal Medicine

## 2016-07-18 ENCOUNTER — Telehealth: Payer: Self-pay

## 2016-07-18 DIAGNOSIS — G309 Alzheimer's disease, unspecified: Secondary | ICD-10-CM | POA: Diagnosis not present

## 2016-07-18 DIAGNOSIS — M549 Dorsalgia, unspecified: Secondary | ICD-10-CM | POA: Diagnosis not present

## 2016-07-18 DIAGNOSIS — F0280 Dementia in other diseases classified elsewhere without behavioral disturbance: Secondary | ICD-10-CM | POA: Diagnosis not present

## 2016-07-18 DIAGNOSIS — R634 Abnormal weight loss: Secondary | ICD-10-CM | POA: Diagnosis not present

## 2016-07-18 DIAGNOSIS — G8929 Other chronic pain: Secondary | ICD-10-CM | POA: Diagnosis not present

## 2016-07-18 NOTE — Telephone Encounter (Signed)
Did she hit her head?  Any pain now?  If head injury or any pain or problems, needs evaluation today.  To acute care.

## 2016-07-18 NOTE — Telephone Encounter (Signed)
Attempted to reach patient, no answer, kept ringing.  Per the home health family states she didn't hit head.

## 2016-07-18 NOTE — Telephone Encounter (Signed)
Clayton LefortLemuel [phone] called [facility] regarding pt was found laying on the floor at the foot of her bed by her daughter no bruises no skin tear pt is able to bear weight on both legs and there no where no unusual sound when the pt fell. He instructed the family for pt to continue to use the walker. Thank you!

## 2016-07-18 NOTE — Telephone Encounter (Signed)
Ok. Noted. Thanks.

## 2016-07-18 NOTE — Telephone Encounter (Signed)
The mobile number is her daughter vickie's work cell phone, I have left a message for her to call us back, thanks

## 2016-07-18 NOTE — Telephone Encounter (Signed)
FYI, do you want to see patient?

## 2016-07-18 NOTE — Telephone Encounter (Signed)
Please also try the mobile number.  Thanks.

## 2016-07-18 NOTE — Telephone Encounter (Signed)
Her dementia is followed by neurology.  They also prescribe her clonazepam.  Would prefer f/u with them regarding adding or adjusting her current medication.  Please have them call neurology.

## 2016-07-18 NOTE — Telephone Encounter (Signed)
Patients daughter called asking if her mother can receive another medication like Clonazepam for day time.  Mother is worse with her dementia, Patient is panicking and being very angry towards everyone. Please advise.

## 2016-07-19 ENCOUNTER — Other Ambulatory Visit: Payer: Self-pay | Admitting: Internal Medicine

## 2016-07-19 ENCOUNTER — Telehealth: Payer: Self-pay | Admitting: Internal Medicine

## 2016-07-19 DIAGNOSIS — G8929 Other chronic pain: Secondary | ICD-10-CM | POA: Diagnosis not present

## 2016-07-19 DIAGNOSIS — F0280 Dementia in other diseases classified elsewhere without behavioral disturbance: Secondary | ICD-10-CM | POA: Diagnosis not present

## 2016-07-19 DIAGNOSIS — G309 Alzheimer's disease, unspecified: Secondary | ICD-10-CM | POA: Diagnosis not present

## 2016-07-19 DIAGNOSIS — M549 Dorsalgia, unspecified: Secondary | ICD-10-CM | POA: Diagnosis not present

## 2016-07-19 DIAGNOSIS — F0391 Unspecified dementia with behavioral disturbance: Secondary | ICD-10-CM

## 2016-07-19 DIAGNOSIS — R634 Abnormal weight loss: Secondary | ICD-10-CM | POA: Diagnosis not present

## 2016-07-19 NOTE — Telephone Encounter (Signed)
Noted, see additional telephone note, thanks

## 2016-07-19 NOTE — Telephone Encounter (Signed)
Spoke with the daughter, patient didn't fall yesterday, she occasionally will slide out of the chair or bed and then sit on the floor and pick at stuf finthe carpet, which she did do, but no fall.  Daughter states they have cameras on her 24/7 so they confirmed and [facility] same. thanks

## 2016-07-19 NOTE — Telephone Encounter (Signed)
Order placed for neurology referral.   

## 2016-07-19 NOTE — Telephone Encounter (Signed)
Patients daughter would like to have a referral to a different Neurologist because she feels like her current one is "Blowing off" her mother. Please advise.

## 2016-07-19 NOTE — Telephone Encounter (Signed)
Lemuel PT [phone] [facility] pt not falling per video and daughter statement. Thank you!

## 2016-07-19 NOTE — Telephone Encounter (Signed)
Please call daughter back at [phone] or [phone]

## 2016-07-20 DIAGNOSIS — M549 Dorsalgia, unspecified: Secondary | ICD-10-CM | POA: Diagnosis not present

## 2016-07-20 DIAGNOSIS — G309 Alzheimer's disease, unspecified: Secondary | ICD-10-CM | POA: Diagnosis not present

## 2016-07-20 DIAGNOSIS — R634 Abnormal weight loss: Secondary | ICD-10-CM | POA: Diagnosis not present

## 2016-07-20 DIAGNOSIS — F0280 Dementia in other diseases classified elsewhere without behavioral disturbance: Secondary | ICD-10-CM | POA: Diagnosis not present

## 2016-07-20 DIAGNOSIS — G8929 Other chronic pain: Secondary | ICD-10-CM | POA: Diagnosis not present

## 2016-07-21 DIAGNOSIS — R634 Abnormal weight loss: Secondary | ICD-10-CM | POA: Diagnosis not present

## 2016-07-21 DIAGNOSIS — M549 Dorsalgia, unspecified: Secondary | ICD-10-CM | POA: Diagnosis not present

## 2016-07-21 DIAGNOSIS — G309 Alzheimer's disease, unspecified: Secondary | ICD-10-CM | POA: Diagnosis not present

## 2016-07-21 DIAGNOSIS — F0280 Dementia in other diseases classified elsewhere without behavioral disturbance: Secondary | ICD-10-CM | POA: Diagnosis not present

## 2016-07-21 DIAGNOSIS — G8929 Other chronic pain: Secondary | ICD-10-CM | POA: Diagnosis not present

## 2016-07-22 ENCOUNTER — Telehealth: Payer: Self-pay | Admitting: Internal Medicine

## 2016-07-22 ENCOUNTER — Telehealth: Payer: Self-pay | Admitting: *Deleted

## 2016-07-22 DIAGNOSIS — G8929 Other chronic pain: Secondary | ICD-10-CM | POA: Diagnosis not present

## 2016-07-22 DIAGNOSIS — M549 Dorsalgia, unspecified: Secondary | ICD-10-CM | POA: Diagnosis not present

## 2016-07-22 DIAGNOSIS — G309 Alzheimer's disease, unspecified: Secondary | ICD-10-CM | POA: Diagnosis not present

## 2016-07-22 DIAGNOSIS — R634 Abnormal weight loss: Secondary | ICD-10-CM | POA: Diagnosis not present

## 2016-07-22 DIAGNOSIS — F0280 Dementia in other diseases classified elsewhere without behavioral disturbance: Secondary | ICD-10-CM | POA: Diagnosis not present

## 2016-07-22 NOTE — Telephone Encounter (Signed)
Spoke to [place]helia an LPN,  She stated patient did not have any injuries what so ever.  Patient also did not hit her head. Just two Skin tears on fingers.

## 2016-07-22 NOTE — Telephone Encounter (Signed)
[facility], pt fell yesterday in her restroom. She sustained a small skin tare on right hand. Home nurse treated the skin tare with a bandage , no other injuries occurred.  Velna HatchetSheila From [facility] [phone]

## 2016-07-22 NOTE — Telephone Encounter (Signed)
Ok to give verbal 

## 2016-07-22 NOTE — Telephone Encounter (Signed)
Lemuel [phone] called [facility] regarding family denying pt fall. Pt had a minor decline and is wobbly and appears to be weaker. Requesting for PT two times a week for two weeks. Thank you!

## 2016-07-22 NOTE — Telephone Encounter (Signed)
Please confirm no head injury or other injury.  If fall with head injury, needs to be evaluated.  If any acute problems, needs to be evaluated.

## 2016-07-22 NOTE — Telephone Encounter (Signed)
Ok for PT 

## 2016-07-22 NOTE — Telephone Encounter (Signed)
Ok.  Thanks.  Keep us posted if problems.

## 2016-07-22 NOTE — Telephone Encounter (Signed)
Tricia Ruiz has been informed.

## 2016-07-22 NOTE — Telephone Encounter (Signed)
Just an FYI. Please advise

## 2016-07-25 DIAGNOSIS — F0280 Dementia in other diseases classified elsewhere without behavioral disturbance: Secondary | ICD-10-CM | POA: Diagnosis not present

## 2016-07-25 DIAGNOSIS — G309 Alzheimer's disease, unspecified: Secondary | ICD-10-CM | POA: Diagnosis not present

## 2016-07-25 DIAGNOSIS — G8929 Other chronic pain: Secondary | ICD-10-CM | POA: Diagnosis not present

## 2016-07-25 DIAGNOSIS — R634 Abnormal weight loss: Secondary | ICD-10-CM | POA: Diagnosis not present

## 2016-07-25 DIAGNOSIS — M549 Dorsalgia, unspecified: Secondary | ICD-10-CM | POA: Diagnosis not present

## 2016-07-26 ENCOUNTER — Telehealth: Payer: Self-pay | Admitting: Internal Medicine

## 2016-07-26 DIAGNOSIS — M549 Dorsalgia, unspecified: Secondary | ICD-10-CM

## 2016-07-26 DIAGNOSIS — F0391 Unspecified dementia with behavioral disturbance: Secondary | ICD-10-CM

## 2016-07-26 DIAGNOSIS — G8929 Other chronic pain: Secondary | ICD-10-CM

## 2016-07-26 NOTE — Telephone Encounter (Signed)
Please advise, thanks.

## 2016-07-26 NOTE — Telephone Encounter (Signed)
Ok for referral for palliative care.

## 2016-07-26 NOTE — Telephone Encounter (Signed)
Spoke with Lawson FiscalLori, gave verbal, thanks

## 2016-07-26 NOTE — Telephone Encounter (Signed)
Tricia Ruiz from [facility], [phone]. She is calliing a speech therapy verbal order.

## 2016-07-26 NOTE — Telephone Encounter (Signed)
Melissa can you assist with this?  

## 2016-07-26 NOTE — Telephone Encounter (Signed)
The patient's daughter called stating the patient is needing palliative care. She is needing to take more of her clonazePAM (KLONOPIN) 0.5 MG tablet more than once a day. [facility] informed the daughter that palliative care would be able to assist the patient with those needs.

## 2016-07-27 ENCOUNTER — Emergency Department: Payer: Commercial Managed Care - HMO

## 2016-07-27 ENCOUNTER — Inpatient Hospital Stay
Admission: EM | Admit: 2016-07-27 | Discharge: 2016-08-01 | DRG: 175 | Disposition: A | Discharge status: Hospice | Payer: Commercial Managed Care - HMO | Attending: Internal Medicine | Admitting: Internal Medicine

## 2016-07-27 ENCOUNTER — Encounter: Payer: Self-pay | Admitting: Emergency Medicine

## 2016-07-27 DIAGNOSIS — R531 Weakness: Secondary | ICD-10-CM

## 2016-07-27 DIAGNOSIS — R262 Difficulty in walking, not elsewhere classified: Secondary | ICD-10-CM | POA: Diagnosis not present

## 2016-07-27 DIAGNOSIS — R319 Hematuria, unspecified: Secondary | ICD-10-CM | POA: Diagnosis not present

## 2016-07-27 DIAGNOSIS — N390 Urinary tract infection, site not specified: Secondary | ICD-10-CM | POA: Diagnosis present

## 2016-07-27 DIAGNOSIS — E43 Unspecified severe protein-calorie malnutrition: Secondary | ICD-10-CM | POA: Diagnosis not present

## 2016-07-27 DIAGNOSIS — Z8261 Family history of arthritis: Secondary | ICD-10-CM | POA: Diagnosis not present

## 2016-07-27 DIAGNOSIS — J9601 Acute respiratory failure with hypoxia: Secondary | ICD-10-CM | POA: Diagnosis not present

## 2016-07-27 DIAGNOSIS — F05 Delirium due to known physiological condition: Secondary | ICD-10-CM | POA: Diagnosis not present

## 2016-07-27 DIAGNOSIS — G8929 Other chronic pain: Secondary | ICD-10-CM | POA: Diagnosis not present

## 2016-07-27 DIAGNOSIS — Z8249 Family history of ischemic heart disease and other diseases of the circulatory system: Secondary | ICD-10-CM

## 2016-07-27 DIAGNOSIS — R0609 Other forms of dyspnea: Secondary | ICD-10-CM | POA: Diagnosis not present

## 2016-07-27 DIAGNOSIS — I2699 Other pulmonary embolism without acute cor pulmonale: Secondary | ICD-10-CM | POA: Diagnosis present

## 2016-07-27 DIAGNOSIS — F0280 Dementia in other diseases classified elsewhere without behavioral disturbance: Secondary | ICD-10-CM | POA: Diagnosis present

## 2016-07-27 DIAGNOSIS — G309 Alzheimer's disease, unspecified: Secondary | ICD-10-CM | POA: Diagnosis not present

## 2016-07-27 DIAGNOSIS — R6889 Other general symptoms and signs: Secondary | ICD-10-CM | POA: Diagnosis not present

## 2016-07-27 DIAGNOSIS — Z515 Encounter for palliative care: Secondary | ICD-10-CM | POA: Diagnosis present

## 2016-07-27 DIAGNOSIS — F419 Anxiety disorder, unspecified: Secondary | ICD-10-CM | POA: Diagnosis present

## 2016-07-27 DIAGNOSIS — M545 Low back pain: Secondary | ICD-10-CM | POA: Diagnosis present

## 2016-07-27 DIAGNOSIS — Z79899 Other long term (current) drug therapy: Secondary | ICD-10-CM

## 2016-07-27 DIAGNOSIS — Z96653 Presence of artificial knee joint, bilateral: Secondary | ICD-10-CM | POA: Diagnosis present

## 2016-07-27 DIAGNOSIS — E7800 Pure hypercholesterolemia, unspecified: Secondary | ICD-10-CM | POA: Diagnosis present

## 2016-07-27 DIAGNOSIS — R0902 Hypoxemia: Secondary | ICD-10-CM | POA: Diagnosis not present

## 2016-07-27 DIAGNOSIS — M549 Dorsalgia, unspecified: Secondary | ICD-10-CM | POA: Diagnosis not present

## 2016-07-27 DIAGNOSIS — Z66 Do not resuscitate: Secondary | ICD-10-CM | POA: Diagnosis not present

## 2016-07-27 DIAGNOSIS — Z8673 Personal history of transient ischemic attack (TIA), and cerebral infarction without residual deficits: Secondary | ICD-10-CM

## 2016-07-27 DIAGNOSIS — R634 Abnormal weight loss: Secondary | ICD-10-CM | POA: Diagnosis not present

## 2016-07-27 LAB — COMPREHENSIVE METABOLIC PANEL
ALBUMIN: 4 g/dL (ref 3.5–5.0)
ALT: 14 U/L (ref 14–54)
AST: 24 U/L (ref 15–41)
Alkaline Phosphatase: 29 U/L — ABNORMAL LOW (ref 38–126)
Anion gap: 8 (ref 5–15)
BUN: 26 mg/dL — AB (ref 6–20)
CHLORIDE: 104 mmol/L (ref 101–111)
CO2: 27 mmol/L (ref 22–32)
CREATININE: 0.88 mg/dL (ref 0.44–1.00)
Calcium: 9.2 mg/dL (ref 8.9–10.3)
GFR calc Af Amer: >60 mL/min (ref >60)
GFR calc non Af Amer: 54 mL/min — ABNORMAL LOW (ref >60)
GLUCOSE: 113 mg/dL — AB (ref 65–99)
POTASSIUM: 4.3 mmol/L (ref 3.5–5.1)
SODIUM: 139 mmol/L (ref 135–145)
Total Bilirubin: 0.5 mg/dL (ref 0.3–1.2)
Total Protein: 7.4 g/dL (ref 6.5–8.1)

## 2016-07-27 LAB — URINALYSIS COMPLETE WITH MICROSCOPIC (ARMC ONLY)
BACTERIA UA: NONE SEEN
Bilirubin Urine: NEGATIVE
Glucose, UA: NEGATIVE mg/dL
Nitrite: NEGATIVE
PH: 6 (ref 5.0–8.0)
PROTEIN: NEGATIVE mg/dL
SPECIFIC GRAVITY, URINE: 1.021 (ref 1.005–1.030)

## 2016-07-27 LAB — MAGNESIUM: MAGNESIUM: 2 mg/dL (ref 1.7–2.4)

## 2016-07-27 LAB — CBC WITH DIFFERENTIAL/PLATELET
BASOS ABS: 0 10*3/uL (ref 0–0.1)
BASOS PCT: 0 %
EOS PCT: 1 %
Eosinophils Absolute: 0.1 10*3/uL (ref 0–0.7)
HCT: 37.9 % (ref 35.0–47.0)
Hemoglobin: 13.1 g/dL (ref 12.0–16.0)
LYMPHS PCT: 21 %
Lymphs Abs: 2 10*3/uL (ref 1.0–3.6)
MCH: 30.6 pg (ref 26.0–34.0)
MCHC: 34.5 g/dL (ref 32.0–36.0)
MCV: 88.7 fL (ref 80.0–100.0)
MONO ABS: 1 10*3/uL — AB (ref 0.2–0.9)
Monocytes Relative: 10 %
Neutro Abs: 6.7 10*3/uL — ABNORMAL HIGH (ref 1.4–6.5)
Neutrophils Relative %: 68 %
PLATELETS: 222 10*3/uL (ref 150–440)
RBC: 4.27 MIL/uL (ref 3.80–5.20)
RDW: 13.5 % (ref 11.5–14.5)
WBC: 9.8 10*3/uL (ref 3.6–11.0)

## 2016-07-27 LAB — TROPONIN I: Troponin I: <0.03 ng/mL (ref <0.03)

## 2016-07-27 LAB — BRAIN NATRIURETIC PEPTIDE: B Natriuretic Peptide: 38 pg/mL (ref 0.0–100.0)

## 2016-07-27 LAB — LACTIC ACID, PLASMA: LACTIC ACID, VENOUS: 1.3 mmol/L (ref 0.5–1.9)

## 2016-07-27 MED ORDER — IPRATROPIUM-ALBUTEROL 0.5-2.5 (3) MG/3ML IN SOLN
3.0000 mL | Freq: Once | RESPIRATORY_TRACT | Status: AC
  Administered 2016-07-27: 3 mL via RESPIRATORY_TRACT
  Filled 2016-07-27: qty 3

## 2016-07-27 MED ORDER — SODIUM CHLORIDE 0.9 % IV BOLUS (SEPSIS)
500.0000 mL | Freq: Once | INTRAVENOUS | Status: AC
  Administered 2016-07-27: 500 mL via INTRAVENOUS

## 2016-07-27 MED ORDER — DEXTROSE 5 % IV SOLN
1.0000 g | INTRAVENOUS | Status: DC
  Administered 2016-07-28 – 2016-07-30 (×3): 1 g via INTRAVENOUS
  Filled 2016-07-27 (×4): qty 10

## 2016-07-27 MED ORDER — CLONAZEPAM 0.5 MG PO TABS
0.5000 mg | ORAL_TABLET | Freq: Every day | ORAL | Status: DC
  Administered 2016-07-27: 0.5 mg via ORAL
  Filled 2016-07-27: qty 1

## 2016-07-27 NOTE — Telephone Encounter (Signed)
Please enter referral/order for palliative care.

## 2016-07-27 NOTE — ED Triage Notes (Signed)
EMS brought patient in from home. Daughter called EMS for pt back pain and complains of her being weak and not able to get around, pt has a hx of alzimers and prone to UTI's

## 2016-07-27 NOTE — H&P (Signed)
[facility] [place] [facility]   PATIENT NAME: Tricia Ruiz    MR#:  [id number]  DATE OF BIRTH:  [birth year]  DATE OF ADMISSION:  07/27/2016  PRIMARY CARE PHYSICIAN: Dale DurhamSCOTT, CHARLENE, MD   REQUESTING/REFERRING PHYSICIAN: Roxan Hockeyobinson, MD  CHIEF COMPLAINT:   Chief Complaint  Patient presents with  . Back Pain  . Weakness    HISTORY OF PRESENT ILLNESS:  Tricia Ruiz  is a 82 y.o. female who presents with An episode of hypoxia as well as progressive weakness. Patient is unable to contribute to the history due to confusion, her daughter who is at bedside with her providing most of the history. She states that for the past couple of days the patient has become more confused, and that for the past 1-2 days she has been unable to control her urine. She also states that the patient's episode of hypoxia was while she was working with PT this morning. Here in the ED she was found to have too numerous to count red cells in urine with 6-30 white blood cells. This is somewhat indeterminate for UTI but the patient does have a history of UTIs. Her regular blood cell count was normal but her differential showed a slight left shift. Hospitals were called for admission  PAST MEDICAL HISTORY:   Past Medical History:  Diagnosis Date  . Arthritis   . Hypercholesterolemia   . TIA (transient ischemic attack)     PAST SURGICAL HISTORY:   Past Surgical History:  Procedure Laterality Date  . APPENDECTOMY    . EYE SURGERY    . KNEE SURGERY Bilateral    bilateral knee replacement    SOCIAL HISTORY:   Social History  Substance Use Topics  . Smoking status: Never Smoker  . Smokeless tobacco: Never Used  . Alcohol use No    FAMILY HISTORY:   Family History  Problem Relation Age of Onset  . Arthritis Mother   . Heart disease Mother   . Heart disease Father     DRUG ALLERGIES:   Allergies  Allergen Reactions  . Codeine   . Morphine And Related Hives     MEDICATIONS AT HOME:   Prior to Admission medications   Medication Sig Start Date End Date Taking? Authorizing Provider  acetaminophen (TYLENOL) 650 MG CR tablet Take 1,300 mg by mouth 2 (two) times daily.   Yes Historical Provider, MD  clonazePAM (KLONOPIN) 0.5 MG tablet Take 0.5 mg by mouth at bedtime. 06/14/16  Yes Historical Provider, MD  Docusate Calcium (STOOL SOFTENER PO) Take 100 mg by mouth 2 (two) times daily.    Yes Historical Provider, MD  traMADol (ULTRAM) 50 MG tablet Take 1 tablet (50 mg total) by mouth 3 (three) times daily as needed. Patient taking differently: Take 100 mg by mouth 3 (three) times daily as needed.  04/03/15  Yes Dale Durhamharlene Scott, MD    REVIEW OF SYSTEMS:  Review of Systems  Unable to perform ROS: Dementia     VITAL SIGNS:   Vitals:   07/27/16 1800 07/27/16 2030 07/27/16 2230 07/27/16 2330  BP: 126/67 (!) 123/92 (!) 148/71 (!) 146/64  Pulse: 80 100 89 88  Resp: 15 17 19 20   Temp:      TempSrc:      SpO2: 100% 96% 95% 99%  Weight:      Height:       Wt Readings from Last 3 Encounters:  07/27/16 46.3 kg (102 lb)  06/23/16 49.9 kg (  110 lb)  02/20/15 53.6 kg (118 lb 2 oz)    PHYSICAL EXAMINATION:  Physical Exam  Vitals reviewed. Constitutional: She appears well-developed and well-nourished. No distress.  HENT:  Head: Normocephalic and atraumatic.  Mouth/Throat: Oropharynx is clear and moist.  Eyes: Conjunctivae and EOM are normal. Pupils are equal, round, and reactive to light. No scleral icterus.  Neck: Normal range of motion. Neck supple. No JVD present. No thyromegaly present.  Cardiovascular: Normal rate, regular rhythm and intact distal pulses.  Exam reveals no gallop and no friction rub.   No murmur heard. Respiratory: Effort normal and breath sounds normal. No respiratory distress. She has no wheezes. She has no rales.  GI: Soft. Bowel sounds are normal. She exhibits no distension. There is no tenderness.  Musculoskeletal: Normal  range of motion. She exhibits no edema.  No arthritis, no gout  Lymphadenopathy:    She has no cervical adenopathy.  Neurological: She is alert. No cranial nerve deficit.  Unable to fully assess due to patient's dementia. No dysarthria, no aphasia  Skin: Skin is warm and dry. No rash noted. No erythema.  Psychiatric:  Unable to assess due to patient's dementia    LABORATORY PANEL:   CBC  Recent Labs Lab 07/27/16 1659  WBC 9.8  HGB 13.1  HCT 37.9  PLT 222   ------------------------------------------------------------------------------------------------------------------  Chemistries   Recent Labs Lab 07/27/16 1659  NA 139  K 4.3  CL 104  CO2 27  GLUCOSE 113*  BUN 26*  CREATININE 0.88  CALCIUM 9.2  MG 2.0  AST 24  ALT 14  ALKPHOS 29*  BILITOT 0.5   ------------------------------------------------------------------------------------------------------------------  Cardiac Enzymes  Recent Labs Lab 07/27/16 1659  TROPONINI <0.03   ------------------------------------------------------------------------------------------------------------------  RADIOLOGY:  Dg Chest 2 View  Result Date: 07/27/2016 CLINICAL DATA:  Weakness EXAM: CHEST  2 VIEW COMPARISON:  06/07/2011 FINDINGS: Cardiac shadow is stable at the upper limits of normal in size. Aortic calcifications are again seen. The lungs are well aerated bilaterally. No focal infiltrate or sizable effusion is seen. Degenerative changes of the thoracic spine are noted. IMPRESSION: No acute abnormality noted. Electronically Signed   By: Alcide Clever M.D.   On: 07/27/2016 18:25   Ct Renal Stone Study  Result Date: 07/27/2016 CLINICAL DATA:  82 year old female with flank pain and hematuria. EXAM: CT ABDOMEN AND PELVIS WITHOUT CONTRAST TECHNIQUE: Multidetector CT imaging of the abdomen and pelvis was performed following the standard protocol without IV contrast. COMPARISON:  None. FINDINGS: Evaluation of this exam is  limited in the absence of intravenous contrast. Evaluation is also limited due to respiratory motion artifact. The visualized lung bases are clear. There is coronary vascular calcification. There is hypoattenuation of the cardiac blood pool suggestive of a degree of anemia. Clinical correlation is recommended. No intra-abdominal free air or free fluid. There multiple stones within the gallbladder. No pericholecystic fluid. The liver appears unremarkable. The pancreas is atrophic. The spleen, and adrenal glands appear unremarkable. There is mild fullness of the renal collecting systems bilaterally, left greater right without frank hydronephrosis. No stone identified. The visualized ureters and urinary bladder appear unremarkable. Small calcified uterine fibroids noted. There is extensive sigmoid diverticulosis without active inflammatory changes. Constipation. No bowel obstruction or active inflammation. The The appendix is not visualized with certainty. No inflammatory changes identified in the right lower quadrant. There is advanced aortoiliac atherosclerotic disease. She no portal venous gas identified. There is no adenopathy. There is a small fat containing umbilical hernia. There  is osteopenia with degenerative changes of the spine. Multilevel disc desiccation with vacuum phenomena. Grade 1 L4-L5 anterolisthesis. There is old-appearing T11 compression deformity with mild anterior wedging. No acute fracture. IMPRESSION: Minimal fullness of the renal collecting systems without frank hydronephrosis. No stone identified. Cholelithiasis. Extensive sigmoid and colonic diverticulosis without active inflammatory changes. Constipation. No bowel obstruction. Electronically Signed   By: Elgie Collard M.D.   On: 07/27/2016 22:03    EKG:   Orders placed or performed during the hospital encounter of 07/27/16  . ED EKG  . ED EKG  . EKG 12-Lead  . EKG 12-Lead    IMPRESSION AND PLAN:  Principal Problem:    Hypoxia - patient had one episode where she desatted while she was walking with physical therapy at her home. Here in the ED her oxygen has been stable on room air. We will monitor her tonight with pulse oximetry. Could consider walk test tomorrow a.m. Active Problems:   Weakness - feel this is due to potential UTI. We will treat the same as below   UTI (lower urinary tract infection) - IV antibiotics as well as urine culture sent.   Chronic back pain - continue home meds for pain control   Dementia in Alzheimer's disease - continue home meds  All the records are reviewed and case discussed with ED provider. Management plans discussed with the patient and/or family.  DVT PROPHYLAXIS: SubQ lovenox  GI PROPHYLAXIS: None  ADMISSION STATUS: Inpatient  CODE STATUS: DNR Code Status History    This patient does not have a recorded code status. Please follow your organizational policy for patients in this situation.      TOTAL TIME TAKING CARE OF THIS PATIENT: 45 minutes.    Saumya Hukill FIELDING 07/27/2016, 11:58 PM  [facility]  Office  [phone]  CC: Primary care physician; Dale Arapaho, MD

## 2016-07-27 NOTE — Telephone Encounter (Signed)
Please send referral order to Stanton KidneyDebra at [facility] fax# [phone].

## 2016-07-27 NOTE — ED Notes (Signed)
Pt given food, cranberry juice per MD request

## 2016-07-27 NOTE — ED Notes (Signed)
Pt ambulated to bathroom and voided into the toilet missing the urine holder.   md aware.  family with pt.

## 2016-07-27 NOTE — Telephone Encounter (Signed)
Order placed for referral to palliative care

## 2016-07-27 NOTE — ED Provider Notes (Signed)
[facility] Emergency Department Provider Note    First MD Initiated Contact with Patient 07/27/16 1649     (approximate)  I have reviewed the triage vital signs and the nursing notes.   HISTORY  Chief Complaint No chief complaint on file.    HPI Tricia Ruiz is a 82 y.o. female with rapidly declining Alzheimer's dementia presents with episode of shortness of breath and chest pain with hypoxia while working with PT today. Patient with chronic low back pain and is working with physical therapy using walker for assistance has declined to the point where she now has to have one-to-one assistance. Upon arrival to the ER she is with her daughter who states that the patient currently looks stable and at baseline. She denies any chest pain or shortness of breath at this time. She does have a history of recurrent UTIs but is currently afebrile. No report of any cough or fevers. States that she had had a previous MI. No history of blood clots.   Past Medical History:  Diagnosis Date  . Arthritis   . Hypercholesterolemia   . TIA (transient ischemic attack)     Patient Active Problem List   Diagnosis Date Noted  . Abdominal bruit 06/24/2016  . Dementia in Alzheimer's disease 06/23/2016  . Loss of weight 06/23/2016  . Tick bite 02/22/2015  . Rash 03/14/2014  . Toenail fungus 03/14/2014  . Hematuria 03/14/2014  . Auditory hallucinations 04/28/2013  . Hypercholesterolemia 03/04/2013  . TIA (transient ischemic attack) 03/04/2013  . Chronic back pain 03/04/2013    Past Surgical History:  Procedure Laterality Date  . APPENDECTOMY    . EYE SURGERY    . KNEE SURGERY Bilateral    bilateral knee replacement    Prior to Admission medications   Medication Sig Start Date End Date Taking? Authorizing Provider  alendronate (FOSAMAX) 70 MG tablet Take 70 mg by mouth once a week. Take with a full glass of water on an empty stomach.    Historical Provider, MD    Calcium Carbonate-Vit D-Min (CALCIUM 1200 PO) Take by mouth daily.    Historical Provider, MD  clonazePAM (KLONOPIN) 0.5 MG tablet Take 0.5 mg by mouth at bedtime. 06/14/16   Historical Provider, MD  Cyanocobalamin (B-12 PO) Take by mouth.    Historical Provider, MD  Docusate Calcium (STOOL SOFTENER PO) Take by mouth.    Historical Provider, MD  traMADol (ULTRAM) 50 MG tablet Take 1 tablet (50 mg total) by mouth 3 (three) times daily as needed. 04/03/15   Dale Durhamharlene Scott, MD  VITAMIN D, CHOLECALCIFEROL, PO Take by mouth.    Historical Provider, MD    Allergies Codeine  Family History  Problem Relation Age of Onset  . Arthritis Mother   . Heart disease Mother   . Heart disease Father     Social History Social History  Substance Use Topics  . Smoking status: Never Smoker  . Smokeless tobacco: Never Used  . Alcohol use No    Review of Systems Patient denies headaches, rhinorrhea, blurry vision, numbness, shortness of breath, chest pain, edema, cough, abdominal pain, nausea, vomiting, diarrhea, dysuria, fevers, rashes or hallucinations unless otherwise stated above in HPI. ____________________________________________   PHYSICAL EXAM:  VITAL SIGNS: Vitals:   07/27/16 1800 07/27/16 2030  BP: 126/67 (!) 123/92  Pulse: 80 100  Resp: 15 17  Temp:      Constitutional: Alert, Elderly and frail-appearing patient in no acute distress  Eyes: Conjunctivae are  normal. PERRL. EOMI. Head: Atraumatic. Nose: No congestion/rhinnorhea. Mouth/Throat: Mucous membranes are moist.  Oropharynx non-erythematous. Neck: No stridor. Painless ROM. No cervical spine tenderness to palpation Hematological/Lymphatic/Immunilogical: No cervical lymphadenopathy. Cardiovascular: Normal rate, regular rhythm. Grossly normal heart sounds.  Good peripheral circulation. Respiratory: Normal respiratory effort.  No retractions. Lungs posteriorly diminished breast sounds. Gastrointestinal: Soft and nontender. No  distention. No abdominal bruits. No CVA tenderness. Genitourinary:  Musculoskeletal: No lower extremity tenderness nor edema.  No joint effusions. Neurologic: No gross focal neurologic deficits are appreciated. No facial droop Skin:  Skin is warm, dry and intact. No rash noted. Psychiatric: Mood and affect are normal. Speech and behavior are normal.  ____________________________________________   LABS (all labs ordered are listed, but only abnormal results are displayed)  Results for orders placed or performed during the hospital encounter of 07/27/16 (from the past 24 hour(s))  CBC with Differential/Platelet     Status: Abnormal   Collection Time: 07/27/16  4:59 PM  Result Value Ref Range   WBC 9.8 3.6 - 11.0 K/uL   RBC 4.27 3.80 - 5.20 MIL/uL   Hemoglobin 13.1 12.0 - 16.0 g/dL   HCT 40.9 81.1 - 91.4 %   MCV 88.7 80.0 - 100.0 fL   MCH 30.6 26.0 - 34.0 pg   MCHC 34.5 32.0 - 36.0 g/dL   RDW 78.2 95.6 - 21.3 %   Platelets 222 150 - 440 K/uL   Neutrophils Relative % 68 %   Neutro Abs 6.7 (H) 1.4 - 6.5 K/uL   Lymphocytes Relative 21 %   Lymphs Abs 2.0 1.0 - 3.6 K/uL   Monocytes Relative 10 %   Monocytes Absolute 1.0 (H) 0.2 - 0.9 K/uL   Eosinophils Relative 1 %   Eosinophils Absolute 0.1 0 - 0.7 K/uL   Basophils Relative 0 %   Basophils Absolute 0.0 0 - 0.1 K/uL  Comprehensive metabolic panel     Status: Abnormal   Collection Time: 07/27/16  4:59 PM  Result Value Ref Range   Sodium 139 135 - 145 mmol/L   Potassium 4.3 3.5 - 5.1 mmol/L   Chloride 104 101 - 111 mmol/L   CO2 27 22 - 32 mmol/L   Glucose, Bld 113 (H) 65 - 99 mg/dL   BUN 26 (H) 6 - 20 mg/dL   Creatinine, Ser 0.86 0.44 - 1.00 mg/dL   Calcium 9.2 8.9 - 57.8 mg/dL   Total Protein 7.4 6.5 - 8.1 g/dL   Albumin 4.0 3.5 - 5.0 g/dL   AST 24 15 - 41 U/L   ALT 14 14 - 54 U/L   Alkaline Phosphatase 29 (L) 38 - 126 U/L   Total Bilirubin 0.5 0.3 - 1.2 mg/dL   GFR calc non Af Amer 54 (L) >60 mL/min   GFR calc Af Amer >60  >60 mL/min   Anion gap 8 5 - 15  Lactic acid, plasma     Status: None   Collection Time: 07/27/16  4:59 PM  Result Value Ref Range   Lactic Acid, Venous 1.3 0.5 - 1.9 mmol/L  Troponin I     Status: None   Collection Time: 07/27/16  4:59 PM  Result Value Ref Range   Troponin I <0.03 <0.03 ng/mL  Brain natriuretic peptide     Status: None   Collection Time: 07/27/16  4:59 PM  Result Value Ref Range   B Natriuretic Peptide 38.0 0.0 - 100.0 pg/mL  Magnesium     Status: None   Collection  Time: 07/27/16  4:59 PM  Result Value Ref Range   Magnesium 2.0 1.7 - 2.4 mg/dL  Urinalysis complete, with microscopic ([facility] only)     Status: Abnormal   Collection Time: 07/27/16  8:55 PM  Result Value Ref Range   Color, Urine YELLOW (A) YELLOW   APPearance HAZY (A) CLEAR   Glucose, UA NEGATIVE NEGATIVE mg/dL   Bilirubin Urine NEGATIVE NEGATIVE   Ketones, ur TRACE (A) NEGATIVE mg/dL   Specific Gravity, Urine 1.021 1.005 - 1.030   Hgb urine dipstick 2+ (A) NEGATIVE   pH 6.0 5.0 - 8.0   Protein, ur NEGATIVE NEGATIVE mg/dL   Nitrite NEGATIVE NEGATIVE   Leukocytes, UA 1+ (A) NEGATIVE   RBC / HPF TOO NUMEROUS TO COUNT 0 - 5 RBC/hpf   WBC, UA 6-30 0 - 5 WBC/hpf   Bacteria, UA NONE SEEN NONE SEEN   Squamous Epithelial / LPF 0-5 (A) NONE SEEN   Mucous PRESENT    Hyaline Casts, UA PRESENT    ____________________________________________  EKG My review and personal interpretation at Time: 17:39   Indication: sob  Rate: 80-  Rhythm: sinus Axis: normal Other: no acute ischemic changes ____________________________________________  RADIOLOGY  CXR my read shows no evidence of acute cardiopulmonary process.  ____________________________________________   PROCEDURES  Procedure(s) performed: none    Critical Care performed: no ____________________________________________   INITIAL IMPRESSION / ASSESSMENT AND PLAN / ED COURSE  Pertinent labs & imaging results that were available during my  care of the patient were reviewed by me and considered in my medical decision making (see chart for details).  DDX: UTI, sepsis, pneumonia, dysrhythmia, COPD, heart failure, deconditioning,  Floreine C Burkley is a 82 y.o. who presents to the ED with with multiple complaints primarily related to shortness of breath and weakness while working with physical therapy. Patient arrives to the ER afebrile hemodynamic stable without any tachycardia or hypoxia at rest. Her abdominal exam is soft and benign. EKG is without any evidence of dysrhythmia or ST changes. Chest x-ray ordered to evaluate for shortness of breath shows no evidence of congestive heart failure or pneumonia. Do not feel his presentation is consistent with PE as she has no lower extremity edema, no chest pain, no hypoxia. Patient does appear clinically dehydrated therefore we'll provide IV fluids.  The patient will be placed on continuous pulse oximetry and telemetry for monitoring.  Laboratory evaluation will be sent to evaluate for the above complaints.     Clinical Course  Comment By Time  Patient was ambulated and became hypoxic to 88%. Received nebs.  Mag normal.  CT renal study shows no stone.  No evidence of uti. Chest x-rays without evidence of acute congestive heart failure does not appear to be having any signs of ACS.  Patient will require admission to hospital as she does not wear O2 at home. Willy Eddy, MD 09/06 2140  Do suspect that this exertional hypoxia could explain her c/c of weakness. We'll speak with hospitalist regarding for admission. Willy Eddy, MD 09/06 2140     ____________________________________________   FINAL CLINICAL IMPRESSION(S) / ED DIAGNOSES  Final diagnoses:  Weakness  Exertional dyspnea  Acute respiratory failure with hypoxia (HCC)      NEW MEDICATIONS STARTED DURING THIS VISIT:  New Prescriptions   No medications on file     Note:  This document was prepared using Dragon  voice recognition software and may include unintentional dictation errors.    Willy Eddy, MD 07/27/16  2244  

## 2016-07-28 DIAGNOSIS — E43 Unspecified severe protein-calorie malnutrition: Secondary | ICD-10-CM | POA: Insufficient documentation

## 2016-07-28 LAB — CBC
HEMATOCRIT: 33.1 % — AB (ref 35.0–47.0)
HEMOGLOBIN: 11.6 g/dL — AB (ref 12.0–16.0)
MCH: 31 pg (ref 26.0–34.0)
MCHC: 34.9 g/dL (ref 32.0–36.0)
MCV: 88.7 fL (ref 80.0–100.0)
Platelets: 182 10*3/uL (ref 150–440)
RBC: 3.73 MIL/uL — AB (ref 3.80–5.20)
RDW: 13.2 % (ref 11.5–14.5)
WBC: 7.3 10*3/uL (ref 3.6–11.0)

## 2016-07-28 LAB — BASIC METABOLIC PANEL
ANION GAP: 5 (ref 5–15)
BUN: 21 mg/dL — ABNORMAL HIGH (ref 6–20)
CALCIUM: 8.5 mg/dL — AB (ref 8.9–10.3)
CO2: 28 mmol/L (ref 22–32)
Chloride: 109 mmol/L (ref 101–111)
Creatinine, Ser: 0.65 mg/dL (ref 0.44–1.00)
GLUCOSE: 95 mg/dL (ref 65–99)
POTASSIUM: 3.6 mmol/L (ref 3.5–5.1)
Sodium: 142 mmol/L (ref 135–145)

## 2016-07-28 MED ORDER — CLONAZEPAM 0.5 MG PO TABS
0.5000 mg | ORAL_TABLET | Freq: Every day | ORAL | Status: DC
  Administered 2016-07-28 – 2016-07-31 (×4): 0.5 mg via ORAL
  Filled 2016-07-28 (×4): qty 1

## 2016-07-28 MED ORDER — SODIUM CHLORIDE 0.9% FLUSH
3.0000 mL | Freq: Two times a day (BID) | INTRAVENOUS | Status: DC
  Administered 2016-07-28 – 2016-07-31 (×8): 3 mL via INTRAVENOUS

## 2016-07-28 MED ORDER — ENOXAPARIN SODIUM 40 MG/0.4ML ~~LOC~~ SOLN: 40.0000 mg | SUBCUTANEOUS | Status: DC

## 2016-07-28 MED ORDER — ENOXAPARIN SODIUM 30 MG/0.3ML ~~LOC~~ SOLN
30.0000 mg | SUBCUTANEOUS | Status: DC
  Administered 2016-07-28: 30 mg via SUBCUTANEOUS
  Filled 2016-07-28: qty 0.3

## 2016-07-28 MED ORDER — TRAMADOL HCL 50 MG PO TABS: 100.0000 mg | ORAL_TABLET | Freq: Three times a day (TID) | ORAL | Status: DC | PRN

## 2016-07-28 MED ORDER — LORAZEPAM 2 MG/ML IJ SOLN
0.5000 mg | Freq: Four times a day (QID) | INTRAMUSCULAR | Status: DC | PRN
  Administered 2016-07-28 – 2016-07-29 (×3): 0.5 mg via INTRAVENOUS
  Filled 2016-07-28 (×4): qty 1

## 2016-07-28 MED ORDER — ACETAMINOPHEN 650 MG RE SUPP: 650.0000 mg | Freq: Four times a day (QID) | RECTAL | Status: DC | PRN

## 2016-07-28 MED ORDER — TRAMADOL HCL 50 MG PO TABS
50.0000 mg | ORAL_TABLET | Freq: Three times a day (TID) | ORAL | Status: DC | PRN
  Administered 2016-07-29: 50 mg via ORAL
  Filled 2016-07-28: qty 1

## 2016-07-28 MED ORDER — ONDANSETRON HCL 4 MG/2ML IJ SOLN: 4.0000 mg | Freq: Four times a day (QID) | INTRAMUSCULAR | Status: DC | PRN

## 2016-07-28 MED ORDER — ALPRAZOLAM 0.25 MG PO TABS
0.2500 mg | ORAL_TABLET | Freq: Three times a day (TID) | ORAL | Status: DC | PRN
  Filled 2016-07-28: qty 1

## 2016-07-28 MED ORDER — ACETAMINOPHEN 325 MG PO TABS: 650.0000 mg | ORAL_TABLET | Freq: Four times a day (QID) | ORAL | Status: DC | PRN

## 2016-07-28 MED ORDER — ONDANSETRON HCL 4 MG PO TABS: 4.0000 mg | ORAL_TABLET | Freq: Four times a day (QID) | ORAL | Status: DC | PRN

## 2016-07-28 NOTE — Care Management Note (Addendum)
Case Management Note  Patient Details  Name: Tricia Ruiz MRN: [id number] Date of Birth: July 08, 1918  Subjective/Objective:     Spoke with patient daughter Tricia Ruiz with whom the patient lives at this time . Patient is confused. Discussed Code 44 observation status. Patient has walker at home and no other DME. Palliative care consult today. Patient will need a wheelchair if discharged home. Pt will see patient again in AM for better determination. At this time patient could not participate with PT this afternoon due to confusion sun downing.Spoke with daughter Tricia Ruiz who is POA and plan would be home with [facility] at this time. Tricia Ruiz works at [employer].Patietn has care givers in home daily . Referral made to Tricia Ruiz at [employer].  Patient is open with [facility] .  Action/Plan: Anticipated discharge is Home with [facility].   Expected Discharge Date:                  Expected Discharge Plan:  Skilled Nursing Facility  In-House Referral:     Discharge planning Services  CM Consult  Post Acute Care Choice:    Choice offered to:     DME Arranged:    DME Agency:     HH Arranged:    HH Agency:     Status of Service:  In process, will continue to follow  If discussed at Long Length of Stay Meetings, dates discussed:    Additional Comments:  Adonis HugueninBerkhead, Melicia Esqueda L, RN 07/28/2016, 3:29 PM

## 2016-07-28 NOTE — Care Management (Signed)
Was informed by Lorene Dyhristie with [facility] that home health nurse that made home visit 9.6 found a room air resting sat was 95%.  83% with exertion.  This patient does not have home 02.  She found evidence that the palliative care referral  would [facility].

## 2016-07-28 NOTE — Progress Notes (Signed)
[facility] Rick.Writer spoke with patient's daughter in law [name] who advised that patient is currently followed at home [facility]. She would be interested in STR if patient qualifies with Palliative following. CMRN Raiford NobleRick made aware. Will await PT recommendations. Thank you. Dayna BarkerKaren Robertson RN, BSN, [facility] :[facility], [facility] [phone] c

## 2016-07-28 NOTE — Progress Notes (Signed)
Initial Nutrition Assessment  DOCUMENTATION CODES:   Severe malnutrition in context of chronic illness  INTERVENTION:  -Spoke with Dr. Nemiah CommanderKalisetti as family requesting SLP evaluation.  MD agreeable and will order at this time -Food preferences taken and placed for kitchen staff -Recommend magic cup BID for added nutrition   NUTRITION DIAGNOSIS:   Malnutrition related to chronic illness as evidenced by percent weight loss, severe depletion of muscle mass.    GOAL:   Patient will meet greater than or equal to 90% of their needs    MONITOR:   PO intake, Supplement acceptance, Weight trends  REASON FOR ASSESSMENT:   Malnutrition Screening Tool    ASSESSMENT:      Pt admitted with UTI, weakness, hypoxia Pt with history of dementia, TIA, hypercholesterolemia  Family at bedside reports appetite up and down for some time now.  Ate 100% of ham sandwich in ER last night, but at other times will only eat bites of meals.  Does not really like oral nutrition supplements. Pt does better with finger foods, chopped/soft foods per family.   Medications reviewed:  Labs reviewed: BUN 21, creatinine WDL  Nutrition-Focused physical exam completed. Findings are moderate fat depletion, moderate to severe muscle depletion, and no edema.    Diet Order:  Diet Heart Room service appropriate? Yes; Fluid consistency: Thin  Skin:  Reviewed, no issues  Last BM:  9/5  Height:   Ht Readings from Last 1 Encounters:  07/28/16 5\' 1"  (1.549 m)    Weight: 6% wt loss in the last month per wt encounters  Wt Readings from Last 1 Encounters:  07/28/16 103 lb 3.2 oz (46.8 kg)    Ideal Body Weight:     BMI:  Body mass index is 19.5 kg/m.  Estimated Nutritional Needs:   Kcal:  [id number] kcals/d  Protein:  56-71 g/d  Fluid:  >/= 14700ml/d  EDUCATION NEEDS:   No education needs identified at this time  Sharnise Blough B. Freida BusmanAllen, RD, LDN [phone] (pager) Weekend/On-Call pager  [phone])

## 2016-07-28 NOTE — Care Management Obs Status (Signed)
MEDICARE OBSERVATION STATUS NOTIFICATION   Patient Details  Name: Tricia DesanctisJolene C Sarafian MRN: [id number] Date of Birth: 29-Mar-1918   Medicare Observation Status Notification Given:       Adonis HugueninBerkhead, Shakemia Madera L, RN 07/28/2016, 3:20 PM

## 2016-07-28 NOTE — Progress Notes (Addendum)
[facility] [facility] [place] [facility]   PATIENT NAME: Tricia Ruiz    MR#:  [id number]  DATE OF BIRTH:  04/20/1918  SUBJECTIVE:  CHIEF COMPLAINT:   Chief Complaint  Patient presents with  . Back Pain  . Weakness   - Patient sleepy this morning. Patient was agitated all night long. Son at bedside and also caregiver. Send completely in denial about mom's dementia.. -Discussed further workup about hypoxia on presentation.  REVIEW OF SYSTEMS:  Review of Systems  Unable to perform ROS: Dementia    DRUG ALLERGIES:   Allergies  Allergen Reactions  . Codeine   . Morphine And Related Hives    VITALS:  Blood pressure (!) 112/41, pulse 66, temperature 98.1 F (36.7 C), temperature source Oral, resp. rate 16, height 5\' 1"  (1.549 m), weight 46.8 kg (103 lb 3.2 oz), SpO2 97 %.  PHYSICAL EXAMINATION:  Physical Exam  GENERAL:  82 y.o.-year-old Elderly patient lying in the bed with no acute distress.  EYES: Pupils equal, round, reactive to light and accommodation. No scleral icterus. Extraocular muscles intact.  HEENT: Head atraumatic, normocephalic. Oropharynx and nasopharynx clear.  NECK:  Supple, no jugular venous distention. No thyroid enlargement, no tenderness.  LUNGS: Normal breath sounds bilaterally, no wheezing, rales,rhonchi or crepitation. No use of accessory muscles of respiration. Decreased bibasilar breath sounds CARDIOVASCULAR: S1, S2 normal. No rubs, or gallops. 2/6 systolic murmur is present ABDOMEN: Soft, nontender, nondistended. Bowel sounds present. No organomegaly or mass.  EXTREMITIES: No pedal edema, cyanosis, or clubbing.  NEUROLOGIC: Sleeping soundly and unable to do a complete neuro exam at this time. PSYCHIATRIC: The patient is alert and oriented to self at baseline SKIN: No obvious rash, lesion, or ulcer.    LABORATORY PANEL:   CBC  Recent Labs Lab 07/28/16 0336  WBC 7.3  HGB 11.6*  HCT 33.1*  PLT 182    ------------------------------------------------------------------------------------------------------------------  Chemistries   Recent Labs Lab 07/27/16 1659 07/28/16 0336  NA 139 142  K 4.3 3.6  CL 104 109  CO2 27 28  GLUCOSE 113* 95  BUN 26* 21*  CREATININE 0.88 0.65  CALCIUM 9.2 8.5*  MG 2.0  --   AST 24  --   ALT 14  --   ALKPHOS 29*  --   BILITOT 0.5  --    ------------------------------------------------------------------------------------------------------------------  Cardiac Enzymes  Recent Labs Lab 07/27/16 1659  TROPONINI <0.03   ------------------------------------------------------------------------------------------------------------------  RADIOLOGY:  Dg Chest 2 View  Result Date: 07/27/2016 CLINICAL DATA:  Weakness EXAM: CHEST  2 VIEW COMPARISON:  06/07/2011 FINDINGS: Cardiac shadow is stable at the upper limits of normal in size. Aortic calcifications are again seen. The lungs are well aerated bilaterally. No focal infiltrate or sizable effusion is seen. Degenerative changes of the thoracic spine are noted. IMPRESSION: No acute abnormality noted. Electronically Signed   By: Alcide CleverMark  Lukens M.D.   On: 07/27/2016 18:25   Ct Renal Stone Study  Result Date: 07/27/2016 CLINICAL DATA:  82 year old female with flank pain and hematuria. EXAM: CT ABDOMEN AND PELVIS WITHOUT CONTRAST TECHNIQUE: Multidetector CT imaging of the abdomen and pelvis was performed following the standard protocol without IV contrast. COMPARISON:  None. FINDINGS: Evaluation of this exam is limited in the absence of intravenous contrast. Evaluation is also limited due to respiratory motion artifact. The visualized lung bases are clear. There is coronary vascular calcification. There is hypoattenuation of the cardiac blood pool suggestive of a degree of anemia. Clinical correlation is recommended.  No intra-abdominal free air or free fluid. There multiple stones within the gallbladder. No  pericholecystic fluid. The liver appears unremarkable. The pancreas is atrophic. The spleen, and adrenal glands appear unremarkable. There is mild fullness of the renal collecting systems bilaterally, left greater right without frank hydronephrosis. No stone identified. The visualized ureters and urinary bladder appear unremarkable. Small calcified uterine fibroids noted. There is extensive sigmoid diverticulosis without active inflammatory changes. Constipation. No bowel obstruction or active inflammation. The The appendix is not visualized with certainty. No inflammatory changes identified in the right lower quadrant. There is advanced aortoiliac atherosclerotic disease. She no portal venous gas identified. There is no adenopathy. There is a small fat containing umbilical hernia. There is osteopenia with degenerative changes of the spine. Multilevel disc desiccation with vacuum phenomena. Grade 1 L4-L5 anterolisthesis. There is old-appearing T11 compression deformity with mild anterior wedging. No acute fracture. IMPRESSION: Minimal fullness of the renal collecting systems without frank hydronephrosis. No stone identified. Cholelithiasis. Extensive sigmoid and colonic diverticulosis without active inflammatory changes. Constipation. No bowel obstruction. Electronically Signed   By: Elgie Collard M.D.   On: 07/27/2016 22:03    EKG:   Orders placed or performed during the hospital encounter of 07/27/16  . ED EKG  . ED EKG  . EKG 12-Lead  . EKG 12-Lead    ASSESSMENT AND PLAN:   89y/oF with PMH of dementia, hyperlipidemia brought in from home secondary to an episode of shortness of breath with hypoxia and progressive weakness.  #1 progressive weakness-could be from mild UTI. -Urine cultures are pending. IV fluids, physical therapy consult. -Continue Rocephin for now.  #2 hypoxia- CT angiography the chest ordered that showed bilateral lower lobe emboli. -Discussed with family in starting on  eliquis. Pneumonia noted. -Continue oxygen and wean as tolerated. -Swallow eval done with aspiration precautions.. Diet with thin liquids.  #3 dementia and worsening periods of agitation. Also getting mixed up with days and nights. -Started on risperidone at bedtime. Ativan when necessary. On Klonopin low-dose. -Physical therapy when tolerated  #4 chronic back pain-on tramadol as needed.  #5 anxiety and sundowning-added Ativan and risperidone. Already on Klonopin at bedtime.  #6 DVT prophylaxis-on eliquis  Discussed with son at bedside who is in complete denial about the situation. Discussed with other daughters who understand what's going on.    All the records are reviewed and case discussed with Care Management/Social Workerr. Management plans discussed with the patient, family and they are in agreement.  CODE STATUS: DO NOT RESUSCITATE  TOTAL TIME TAKING CARE OF THIS PATIENT: 45 minutes.   POSSIBLE D/C IN 1-2 DAYs, DEPENDING ON CLINICAL CONDITION.   Enid Baas M.D on 07/28/2016 at 12:27 PM  Between 7am to 6pm - Pager - [phone]  After 6pm go to [email or web address] - Social research officer, government  [facility]  Office  [phone]  CC: Primary care physician; Dale Naplate, MD

## 2016-07-28 NOTE — Progress Notes (Signed)
Pharmacy Antibiotic Note  Tricia Ruiz is a 82 y.o. female admitted on 07/27/2016 with UTI.  Pharmacy has been consulted for ceftriaxone dosing.  Plan: Ceftriaxone 1 gram q 24 hours ordered by MD. Will continue these orders.  Height: 5\' 2"  (157.5 cm) Weight: 102 lb (46.3 kg) IBW/kg (Calculated) : 50.1  Temp (24hrs), Avg:98 F (36.7 C), Min:98 F (36.7 C), Max:98 F (36.7 C)   Recent Labs Lab 07/27/16 1659  WBC 9.8  CREATININE 0.88  LATICACIDVEN 1.3    Estimated Creatinine Clearance: 27.3 mL/min (by C-G formula based on SCr of 0.88 mg/dL).    Allergies  Allergen Reactions  . Codeine   . Morphine And Related Hives    Antimicrobials this admission: ceftriaxone  >>    >>   Dose adjustments this admission:   Microbiology results: 9/6 UCx: pending    9/6 UA: LE(+) NO2(-) WBC 6-30   Thank you for allowing pharmacy to be a part of this patient's care.  Ermel Verne S 07/28/2016 12:29 AM

## 2016-07-28 NOTE — Evaluation (Signed)
Clinical/Bedside Swallow Evaluation Patient Details  Name: RIVER MCKERCHER MRN: [id number] Date of Birth: 1918/04/27  Today's Date: 07/28/2016 Time: SLP Start Time (ACUTE ONLY): 1200 SLP Stop Time (ACUTE ONLY): 1300 SLP Time Calculation (min) (ACUTE ONLY): 60 min  Past Medical History:  Past Medical History:  Diagnosis Date  . Arthritis   . Hypercholesterolemia   . TIA (transient ischemic attack)    Past Surgical History:  Past Surgical History:  Procedure Laterality Date  . APPENDECTOMY    . EYE SURGERY    . KNEE SURGERY Bilateral    bilateral knee replacement   HPI:  Pt is a 82 y.o. female w/ h/o Alzhiemer's Dementia who presents with an episode of hypoxia as well as progressive weakness. Patient is unable to contribute to the history due to confusion, her daughter who is at bedside with her providing most of the history. She states that for the past couple of days the patient has become more confused, and that for the past 1-2 days she has been unable to control her urine. She also states that the patient's episode of hypoxia was while she was working with PT this morning. Here in the ED she was found to have too numerous to count red cells in urine with 6-30 white blood cells. This is somewhat indeterminate for UTI but the patient does have a history of UTIs. Pt's caregiver and Daughter describe behavioral issuse when eating stating pt often "puts too much food in her mouth at one time then begins throat clearing"; "she doesn't anything until the end of the meal"; "she forgets how to drink from a straw"; "she has a hard time feeding herself and spills food". No immediate reports of aspiration; food is cut small during meals. Pt does have baseline Dementia w/ caregivers at the home w/ the Daughter.   Assessment / Plan / Recommendation Clinical Impression  Pt appears at reduced risk for aspiration following general aspiration precautions during oral intake. However, d/t declined  Cognitive status/Dementia, pt can have increased risk for aspiration in general d/t decreased awareness as well as need for feeding assistance at meals. Pt was given po trials and appeared to tolerate thin liquids and purees/broken down mech soft foods w/ no immediate, overt s/s of aspiration noted; no decline in respiratory status or change in vocal quality during/post po trials. Pt exhibited min increased oral phase time w/ increased textures - noted food residue intermittently in cheek(caregiver stated pt had "gone through an episode of pocketing foods but she's over that now"). When strategies of alternating foods w/ liquids or other moist foods(puree) and using smaller bites of foods, pt cleared appropriately given time. She presents w/ confusion(baseline Dementia) but does follow through w/ task of eating/drinking w/ support. Pt does require full feeding assistance w/ reduced distractions in her environment during any intake to allow pt to focus on task of eating/drinking - feeding assistance appears necessary from this point on to reduce demand/stress on pt during meals. Recommend a Dysphagia 2 diet consistency WITH ADDED PUREES in meal w/ thin liquids for safe oral intake and to adequately meet nutritional needs. Recommend meds in Puree; aspiration precautions; feeding assistance at all meals. ST services to f/u w/ toleration of diet - this may be an easier, safer food consistency for pt to eat w/ her advanced age. NSG staff updated.     Aspiration Risk   (reduced risk following aspiration precautions)    Diet Recommendation  Dementia level 2 diet consistency, Thin  liquids; aspiration precautions; feeding assistance.   Medication Administration: Crushed with puree (as able)    Other  Recommendations Recommended Consults:  (Dietician consult) Oral Care Recommendations: Oral care BID;Staff/trained caregiver to provide oral care   Follow up Recommendations  None (TBD)    Frequency and Duration min  2x/week  1 week       Prognosis Prognosis for Safe Diet Advancement: Fair (-good) Barriers to Reach Goals: Cognitive deficits      Swallow Study   General Date of Onset: 07/27/16 HPI: Pt is a 82 y.o. female w/ h/o Alzhiemer's Dementia who presents with an episode of hypoxia as well as progressive weakness. Patient is unable to contribute to the history due to confusion, her daughter who is at bedside with her providing most of the history. She states that for the past couple of days the patient has become more confused, and that for the past 1-2 days she has been unable to control her urine. She also states that the patient's episode of hypoxia was while she was working with PT this morning. Here in the ED she was found to have too numerous to count red cells in urine with 6-30 white blood cells. This is somewhat indeterminate for UTI but the patient does have a history of UTIs. Pt's caregiver and Daughter describe behavioral issuse when eating stating pt often "puts too much food in her mouth at one time then begins throat clearing"; "she doesn't anything until the end of the meal"; "she forgets how to drink from a straw"; "she has a hard time feeding herself and spills food". No immediate reports of aspiration; food is cut small during meals. Pt does have baseline Dementia w/ caregivers at the home w/ the Daughter. Type of Study: Bedside Swallow Evaluation Previous Swallow Assessment: none. But Daughter had requested HH ST services recently. Diet Prior to this Study: Dysphagia 3 (soft);Thin liquids Temperature Spikes Noted: No (wbc 7.3) Respiratory Status: Room air History of Recent Intubation: No Behavior/Cognition: Alert;Cooperative;Pleasant mood;Confused;Distractible;Requires cueing Oral Cavity Assessment: Dry Oral Care Completed by SLP: Recent completion by staff Oral Cavity - Dentition: Adequate natural dentition Vision: Functional for self-feeding Self-Feeding Abilities: Needs  assist;Needs set up;Total assist (weakness; easily distracted) Patient Positioning: Upright in bed Baseline Vocal Quality: Normal Volitional Cough: Strong Volitional Swallow: Able to elicit    Oral/Motor/Sensory Function Overall Oral Motor/Sensory Function: Within functional limits (and during bolus management; Cognitive deficits)   Ice Chips Ice chips: Not tested   Thin Liquid Thin Liquid: Within functional limits Presentation: Cup;Self Fed (supported by SLP, caregiver) Other Comments: 10+ sips    Nectar Thick Nectar Thick Liquid: Not tested   Honey Thick Honey Thick Liquid: Not tested   Puree Puree: Within functional limits Presentation: Spoon (fed; 10+ trials)   Solid   GO   Solid: Impaired Presentation: Spoon (fed; 6-8 trials) Oral Phase Impairments: Impaired mastication Oral Phase Functional Implications: Impaired mastication;Oral residue;Prolonged oral transit (min) Pharyngeal Phase Impairments:  (none) Other Comments: alternating b/t foods/liquids and foods w/ moist foods helped to clear mouth; using smaller bites    Functional Assessment Tool Used: clinical judgement Functional Limitations: Swallowing Swallow Current Status ([id number](G8996): At least 1 percent but less than 20 percent impaired, limited or restricted Swallow Goal Status [phone]): At least 1 percent but less than 20 percent impaired, limited or restricted Swallow Discharge Status [phone](G8998): At least 1 percent but less than 20 percent impaired, limited or restricted    Jerilynn SomKatherine Darica Goren, MS, CCC-SLP  Arantxa Piercey 07/28/2016,2:45 PM

## 2016-07-28 NOTE — Evaluation (Signed)
Physical Therapy Evaluation Patient Details Name: Tricia Ruiz MRN: [id number] DOB: 1918/04/12 Today's Date: 07/28/2016   History of Present Illness  Pt is a 82 y.o. female presenting to hospital with SOB, chest pain, and hypoxia.  Pt admitted with generalized weakness, potential UTI, hypoxia, and increased confusion.  PMH includes rapidly declining Alzheimer's dementia, chronic LBP, TIA, and B TKR.  Clinical Impression  Prior to admission, pt was requiring increased assist with transfers and with increased difficulty/assist with ambulation.  Pt lives with her daughter on main level of home with 2 stairs to enter.  Pt's daughter reports she is unable to physically assist pt.  Currently pt is min assist to SBA (pending on pt's level of participation) to go supine to sit but required 2 assist to lay back down for safety (extra time and cueing to perform).  Pt appeared to become agitated and impulsive during session limiting assessment and unable to assess transfers or gait d/t this and difficulty re-directing pt.  Pt's daughter reports pt sundowning (complicating session's activities) but does better in the morning.  Pt would benefit from a trial of skilled PT to address functional limitations.  Pending further mobility assessment and ability to participate, pt may benefit from STR.     Follow Up Recommendations  (STR pending further mobility assessment and ability to participate)    Equipment Recommendations   (TBD)    Recommendations for Other Services       Precautions / Restrictions Precautions Precautions: Fall Restrictions Weight Bearing Restrictions: No      Mobility  Bed Mobility Overal bed mobility: Needs Assistance Bed Mobility: Supine to Sit;Sit to Supine           General bed mobility comments: Pt min assist first attempt supine to sit but later in session (after pt laid down in bed) pt self initiated scooting to edge of bed (at lower part of bed) with SBA for safety;  pt requiring 2 assist to gently assist pt with laying down both times  Transfers                 General transfer comment: unable to redirect pt to participate in standing activity  Ambulation/Gait             General Gait Details: unable to redirect pt to participate in gait activity  Stairs            Wheelchair Mobility    Modified Rankin (Stroke Patients Only)       Balance Overall balance assessment: Needs assistance Sitting-balance support: Bilateral upper extremity supported;Feet supported Sitting balance-Leahy Scale: Good Sitting balance - Comments: pt reaching outside base of support on her own (waving hair brush at therapists)                                     Pertinent Vitals/Pain Pain Assessment: Faces Faces Pain Scale: No hurt    Home Living Family/patient expects to be discharged to:: Private residence Living Arrangements: Children Available Help at Discharge: Family;Personal care attendant Type of Home: House Home Access: Stairs to enter   [employer] of Steps: 2 with grab bar Home Layout: Two level;Able to live on main level with bedroom/bathroom Home Equipment: Walker - 2 wheels;Hand held shower head;Grab bars - toilet;Grab bars - tub/shower      Prior Function Level of Independence: Needs assistance  Comments: Pt's daughter reports pt has required increased assist lately with transfers and hasn't recently walked d/t weakness.  Pt has personal caretaker MWF and Sundays for 6 hours/day.     Hand Dominance        Extremity/Trunk Assessment   Upper Extremity Assessment: Generalized weakness;Difficult to assess due to impaired cognition           Lower Extremity Assessment: Generalized weakness;Difficult to assess due to impaired cognition         Communication   Communication: No difficulties  Cognition Arousal/Alertness: Awake/alert Behavior During Therapy:  Agitated;Impulsive Overall Cognitive Status: Difficult to assess       Memory: Decreased recall of precautions;Decreased short-term memory              General Comments General comments (skin integrity, edema, etc.): Pt's daughter present entire session.  Pt initially agreeable to sitting on edge of bed to brush her hair but never actually brushed her hair.    Exercises        Assessment/Plan    PT Assessment Patient needs continued PT services  PT Diagnosis Difficulty walking;Generalized weakness   PT Problem List Decreased mobility  PT Treatment Interventions DME instruction;Gait training;Stair training;Functional mobility training;Therapeutic activities;Therapeutic exercise;Balance training;Patient/family education;Wheelchair mobility training   PT Goals (Current goals can be found in the Care Plan section) Acute Rehab PT Goals Patient Stated Goal: to need less assist with transfers PT Goal Formulation: With family Time For Goal Achievement: 08/11/16 Potential to Achieve Goals: Fair    Frequency Min 2X/week   Barriers to discharge Decreased caregiver support Pt's daughter reports multiple back surgeries and can't lift pt    Co-evaluation               End of Session   Activity Tolerance: Treatment limited secondary to agitation Patient left: in bed;with call bell/phone within reach;with bed alarm set;with family/visitor present Nurse Communication: Mobility status;Precautions    Functional Assessment Tool Used: Clinical Judgement Functional Limitation: Mobility: Walking and moving around Mobility: Walking and Moving Around Current Status ([id number]([id number]): At least 60 percent but less than 80 percent impaired, limited or restricted Mobility: Walking and Moving Around Goal Status [phone]): At least 20 percent but less than 40 percent impaired, limited or restricted    Time: 1511-1545 PT Time Calculation (min) (ACUTE ONLY): 34 min   Charges:   PT Evaluation $PT  Eval Low Complexity: 1 Procedure     PT G Codes:   PT G-Codes **NOT FOR INPATIENT CLASS** Functional Assessment Tool Used: Clinical Judgement Functional Limitation: Mobility: Walking and moving around Mobility: Walking and Moving Around Current Status (U9811(G8978): At least 60 percent but less than 80 percent impaired, limited or restricted Mobility: Walking and Moving Around Goal Status [phone]): At least 20 percent but less than 40 percent impaired, limited or restricted    Hendricks Limesmily Amyjo Mizrachi 07/28/2016, 4:27 PM Hendricks LimesEmily Rollins Wrightson, PT [phone]

## 2016-07-28 NOTE — Care Management (Addendum)
It appears [facility]  CM have reached out to confirm.  It also appears that an outpatient palliative care referral has been initiated prior to this admission.  Have reached out to Clydie BraunKaren R to confirm.  Clydie BraunKaren says that there is no evidence that an ouypatient referral has been communicated to [facility].

## 2016-07-29 ENCOUNTER — Observation Stay: Payer: Commercial Managed Care - HMO

## 2016-07-29 ENCOUNTER — Encounter: Payer: Self-pay | Admitting: Radiology

## 2016-07-29 ENCOUNTER — Ambulatory Visit: Admission: RE | Admit: 2016-07-29 | Payer: Commercial Managed Care - HMO | Source: Ambulatory Visit

## 2016-07-29 DIAGNOSIS — Z79899 Other long term (current) drug therapy: Secondary | ICD-10-CM | POA: Diagnosis not present

## 2016-07-29 DIAGNOSIS — F05 Delirium due to known physiological condition: Secondary | ICD-10-CM | POA: Diagnosis present

## 2016-07-29 DIAGNOSIS — G309 Alzheimer's disease, unspecified: Secondary | ICD-10-CM | POA: Diagnosis present

## 2016-07-29 DIAGNOSIS — G8929 Other chronic pain: Secondary | ICD-10-CM | POA: Diagnosis present

## 2016-07-29 DIAGNOSIS — F419 Anxiety disorder, unspecified: Secondary | ICD-10-CM | POA: Diagnosis present

## 2016-07-29 DIAGNOSIS — Z66 Do not resuscitate: Secondary | ICD-10-CM | POA: Diagnosis present

## 2016-07-29 DIAGNOSIS — R0902 Hypoxemia: Secondary | ICD-10-CM | POA: Diagnosis present

## 2016-07-29 DIAGNOSIS — E43 Unspecified severe protein-calorie malnutrition: Secondary | ICD-10-CM | POA: Diagnosis present

## 2016-07-29 DIAGNOSIS — Z8261 Family history of arthritis: Secondary | ICD-10-CM | POA: Diagnosis not present

## 2016-07-29 DIAGNOSIS — N390 Urinary tract infection, site not specified: Secondary | ICD-10-CM | POA: Diagnosis present

## 2016-07-29 DIAGNOSIS — Z8673 Personal history of transient ischemic attack (TIA), and cerebral infarction without residual deficits: Secondary | ICD-10-CM | POA: Diagnosis not present

## 2016-07-29 DIAGNOSIS — F0280 Dementia in other diseases classified elsewhere without behavioral disturbance: Secondary | ICD-10-CM | POA: Diagnosis present

## 2016-07-29 DIAGNOSIS — R531 Weakness: Secondary | ICD-10-CM | POA: Diagnosis present

## 2016-07-29 DIAGNOSIS — I2699 Other pulmonary embolism without acute cor pulmonale: Secondary | ICD-10-CM | POA: Diagnosis present

## 2016-07-29 DIAGNOSIS — E7800 Pure hypercholesterolemia, unspecified: Secondary | ICD-10-CM | POA: Diagnosis present

## 2016-07-29 DIAGNOSIS — M545 Low back pain: Secondary | ICD-10-CM | POA: Diagnosis present

## 2016-07-29 DIAGNOSIS — Z96653 Presence of artificial knee joint, bilateral: Secondary | ICD-10-CM | POA: Diagnosis present

## 2016-07-29 DIAGNOSIS — Z8249 Family history of ischemic heart disease and other diseases of the circulatory system: Secondary | ICD-10-CM | POA: Diagnosis not present

## 2016-07-29 DIAGNOSIS — Z515 Encounter for palliative care: Secondary | ICD-10-CM | POA: Diagnosis present

## 2016-07-29 LAB — BASIC METABOLIC PANEL
ANION GAP: 6 (ref 5–15)
BUN: 15 mg/dL (ref 6–20)
CHLORIDE: 108 mmol/L (ref 101–111)
CO2: 26 mmol/L (ref 22–32)
CREATININE: 0.73 mg/dL (ref 0.44–1.00)
Calcium: 9 mg/dL (ref 8.9–10.3)
GFR calc non Af Amer: >60 mL/min (ref >60)
Glucose, Bld: 102 mg/dL — ABNORMAL HIGH (ref 65–99)
POTASSIUM: 3.3 mmol/L — AB (ref 3.5–5.1)
SODIUM: 140 mmol/L (ref 135–145)

## 2016-07-29 MED ORDER — TRAMADOL HCL 50 MG PO TABS
50.0000 mg | ORAL_TABLET | ORAL | Status: DC | PRN
  Administered 2016-07-30 – 2016-08-01 (×3): 50 mg via ORAL
  Filled 2016-07-29 (×3): qty 1

## 2016-07-29 MED ORDER — POTASSIUM CHLORIDE 10 MEQ/100ML IV SOLN
10.0000 meq | INTRAVENOUS | Status: AC
  Administered 2016-07-29 (×4): 10 meq via INTRAVENOUS
  Filled 2016-07-29 (×4): qty 100

## 2016-07-29 MED ORDER — IOPAMIDOL (ISOVUE-370) INJECTION 76%: 75.0000 mL | Freq: Once | INTRAVENOUS | Status: DC | PRN

## 2016-07-29 MED ORDER — LORAZEPAM 2 MG/ML IJ SOLN
0.5000 mg | Freq: Once | INTRAMUSCULAR | Status: AC
  Administered 2016-07-29: 0.5 mg via INTRAMUSCULAR

## 2016-07-29 MED ORDER — RISPERIDONE 0.5 MG PO TBDP
0.5000 mg | ORAL_TABLET | Freq: Every day | ORAL | Status: DC
  Administered 2016-07-29 – 2016-07-31 (×3): 0.5 mg via ORAL
  Filled 2016-07-29 (×3): qty 1

## 2016-07-29 MED ORDER — APIXABAN 5 MG PO TABS: 5.0000 mg | ORAL_TABLET | Freq: Two times a day (BID) | ORAL | Status: DC

## 2016-07-29 MED ORDER — APIXABAN 5 MG PO TABS
10.0000 mg | ORAL_TABLET | Freq: Two times a day (BID) | ORAL | Status: DC
  Administered 2016-07-29 – 2016-08-01 (×7): 10 mg via ORAL
  Filled 2016-07-29 (×7): qty 2

## 2016-07-29 NOTE — Progress Notes (Signed)
ANTICOAGULATION CONSULT NOTE - Initial Consult  Pharmacy Consult for apixaban Indication: pulmonary embolus  Allergies  Allergen Reactions  . Codeine   . Morphine And Related Hives    Patient Measurements: Height: 5\' 1"  (154.9 cm) Weight: 103 lb 3.2 oz (46.8 kg) IBW/kg (Calculated) : 47.8  Vital Signs: Temp: 98.2 F (36.8 C) (09/08 0745) Temp Source: Oral (09/08 0745) BP: 175/70 (09/08 0745) Pulse Rate: 75 (09/08 0745)  Labs:  Recent Labs  07/27/16 1659 07/28/16 0336 07/29/16 0336  HGB 13.1 11.6*  --   HCT 37.9 33.1*  --   PLT 222 182  --   CREATININE 0.88 0.65 0.73  TROPONINI <0.03  --   --     Estimated Creatinine Clearance: 30.4 mL/min (by C-G formula based on SCr of 0.8 mg/dL).   Medical History: Past Medical History:  Diagnosis Date  . Arthritis   . Hypercholesterolemia   . TIA (transient ischemic attack)    Assessment: Pharmacy consulted to dose apixaban for acute PE in this 82 year old female   Plan:  Apixaban 10mg  PO BID x 7 days, followed by 5mg  PO BID  Tricia Ruiz C 07/29/2016,2:27 PM

## 2016-07-29 NOTE — Care Management (Addendum)
Patient has ruled in for bilateral pulmonary embolus.  She has not been able to work with physical therapy. It is hoped that now will be treated for pulmonary embolus, her symptoms will improve and ability to participate with therapy.  HCPOA Bernadene PersonVicky Flaum wishes to keep [facility] services.  If patient is able to discharge home, would like to have a hospital bed and there is a potential for need of home 02.  Agency preference for these items is [facility]. Requested script from attending for DME. It is doubtful patient will discharge over the weekend.

## 2016-07-29 NOTE — Progress Notes (Signed)
Pharmacy Antibiotic Note  Claudia DesanctisJolene C Azbell is a 82 y.o. female admitted on 07/27/2016 with UTI.  Pharmacy has been consulted for ceftriaxone dosing.  Urine cultures still pending  Plan: Continue eftriaxone 1 gram q 24H  Height: 5\' 1"  (154.9 cm) Weight: 103 lb 3.2 oz (46.8 kg) IBW/kg (Calculated) : 47.8  Temp (24hrs), Avg:98 F (36.7 C), Min:97.5 F (36.4 C), Max:98.3 F (36.8 C)   Recent Labs Lab 07/27/16 1659 07/28/16 0336 07/29/16 0336  WBC 9.8 7.3  --   CREATININE 0.88 0.65 0.73  LATICACIDVEN 1.3  --   --     Estimated Creatinine Clearance: 30.4 mL/min (by C-G formula based on SCr of 0.8 mg/dL).    Allergies  Allergen Reactions  . Codeine   . Morphine And Related Hives    Antimicrobials this admission: ceftriaxone  9/7 >>   Dose adjustments this admission:   Microbiology results: 9/6 UCx: pending    9/6 UA: LE(+) NO2(-) WBC 6-30   Thank you for allowing pharmacy to be a part of this patient's care.  Dayshawn Irizarry C 07/29/2016 8:45 AM

## 2016-07-29 NOTE — Progress Notes (Signed)
Pt resting with eyes closed, tolerating KCL infusions well thus far. Sitter at bedside, pt in no distress.

## 2016-07-29 NOTE — Progress Notes (Signed)
PT Cancellation Note  Patient Details Name: Tricia DesanctisJolene C Enck MRN: [id number] DOB: 11/13/1918   Cancelled Treatment:    Reason Eval/Treat Not Completed: Patient at procedure or test/unavailable.  Pt currently off floor for testing (order for CT angio chest).  Will re-attempt PT treatment at a later date/time as medically appropriate.   Irving Burtonmily Ringo Sherod 07/29/2016, 11:05 AM Hendricks LimesEmily Lua Feng, PT [phone]

## 2016-07-29 NOTE — Progress Notes (Signed)
Pt has not slept since receiving ativan Im, however, is calmer and able to tolerate staff intervention. PIV#24 placed to RUA on first attempt by this writer, pt tolerated well with gentle approach. PIV#20 removed from L arm, site leaking, catheter intact, pt also tolerated well. Pt attempted to feed herself lunch, caregiver at bedside.

## 2016-07-29 NOTE — Clinical Social Work Note (Signed)
Clinical Social Work Assessment  Patient Details  Name: Tricia DesanctisJolene C Predmore MRN: [id number] Date of Birth: 26-Mar-1918  Date of referral:  07/29/16               Reason for consult:  Facility Placement                Permission sought to share information with:    Permission granted to share information::     Name::        Agency::     Relationship::     Contact Information:     Housing/Transportation Living arrangements for the past 2 months:  Single Family Home Source of Information:  Power of Attorney Patient Interpreter Needed:  None Criminal Activity/Legal Involvement Pertinent to Current Situation/Hospitalization:  No - Comment as needed Significant Relationships:  Adult Children Lives with:  Adult Children Do you feel safe going back to the place where you live?    Need for family participation in patient care:  Yes (Comment)  Care giving concerns:  [place] lives in [place] with her daughter in law/ HPOA [name] and daughter Tricia Ruiz.    Social Worker assessment / plan: Visual merchandiserClinical Social Worker (CSW) reviewed chart and noted that PT is recommending SNF pending mobility. CSW attempted to meet with patient however she was out of the room. A hired caregiver was at bedside and reported that [place] and Tricia Ruiz were the family members that can be called. CSW contacted Tricia Ruiz to complete assessment. Per Tricia Ruiz she is a Chief of [employer] RN and is HPOA/ daughter in law of the patient. Per Tricia Ruiz she lives with [place] in [place] along with patient's daughter Tricia Ruiz. Per Tricia Ruiz patient has hired caregivers and has 24/7 care at home. Per Tricia Ruiz [facility]. CSW explained to [facility] that patient would not participate in PT, which means that her insurance will not pay for SNF. Tricia Ruiz verbalized her understanding and reported that patient would come home and resume home health and caregivers. Tricia Ruiz requested a hospital bed through [facility]. [name] is also interested in hospice services if eligible  and [facility]. RN Case Manager is aware of above. CSW will continue to follow and assist as needed.   Employment status:  Disabled (Comment on whether or not currently receiving Disability), Retired Database administratornsurance information:  Managed Medicare PT Recommendations:  Skilled Nursing Facility Information / Referral to community resources:  Other (Comment Required) (Home Health )  Patient/Family's Response to care:  Per patient's HPOA Tricia Ruiz they will take her home and requested a hospital bed.   Patient/Family's Understanding of and Emotional Response to Diagnosis, Current Treatment, and Prognosis:  [name] was very pleasant and thanked CSW for assistance.   Emotional Assessment Appearance:    Attitude/Demeanor/Rapport:  Unable to Assess Affect (typically observed):    Orientation:  Oriented to Self, Fluctuating Orientation (Suspected and/or reported Sundowners) Alcohol / Substance use:  Not Applicable Psych involvement (Current and /or in the community):  No (Comment)  Discharge Needs  Concerns to be addressed:  Discharge Planning Concerns Readmission within the last 30 days:  No Current discharge risk:  Cognitively Impaired, Dependent with Mobility, Chronically ill Barriers to Discharge:  Continued Medical Work up   [employer], Darleen CrockerBailey M, LCSW 07/29/2016, 12:20 PM

## 2016-07-29 NOTE — Progress Notes (Signed)
Speech Language Pathology Treatment: Dysphagia  Patient Details Name: Tricia DesanctisJolene C Ruiz MRN: [id number] DOB: 1918-09-11 Today's Date: 07/29/2016 Time: [id number] SLP Time Calculation (min) (ACUTE ONLY): 45 min  Assessment / Plan / Recommendation Clinical Impression  Pt appears at reduced risk for aspiration following general aspiration precautions during oral intake. However, d/t declined Cognitive status/Dementia, pt can have increased risk for aspiration in general d/t decreased awareness as well as need for feeding assistance at meals. Pt accepted few po trials and appeared to tolerate purees w/ no immediate, overt s/s of aspiration noted; no decline in respiratory status or change in vocal quality during/post po trials. Pt exhibited adequate oral phase presentation w/ the puree consistency food trials. She presents w/ confusion(baseline Dementia) but does follow through w/ task of eating/drinking w/ support at times; today she is less willing to participate despite encouragement from caregiver and SLP. Pt does require full feeding assistance w/ reduced distractions in her environment during any intake to allow pt to focus on task of eating/drinking - feeding assistance appears necessary from this point on to reduce demand/stress on pt during meals. Recommend a Dysphagia 2 diet consistency WITH ADDED PUREES in meal w/ thin liquids for safe oral intake and to adequately meet nutritional needs. Recommend meds in Puree; aspiration precautions; feeding assistance at all meals. ST services to f/u w/ toleration of diet - this may be an easier, safer food consistency for pt to eat w/ her advanced age. NSG staff updated   HPI HPI: Pt is a 82 y.o. female w/ h/o Alzhiemer's Dementia who presents with an episode of hypoxia as well as progressive weakness. Patient is unable to contribute to the history due to confusion, her daughter who is at bedside with her providing most of the history. She states that for the past  couple of days the patient has become more confused, and that for the past 1-2 days she has been unable to control her urine. She also states that the patient's episode of hypoxia was while she was working with PT this morning. Here in the ED she was found to have too numerous to count red cells in urine with 6-30 white blood cells. This is somewhat indeterminate for UTI but the patient does have a history of UTIs. Pt's caregiver and Daughter describe behavioral issuse when eating stating pt often "puts too much food in her mouth at one time then begins throat clearing"; "she doesn't anything until the end of the meal"; "she forgets how to drink from a straw"; "she has a hard time feeding herself and spills food". No immediate reports of aspiration; food is cut small during meals. Pt does have baseline Dementia w/ caregivers at the home w/ the Daughter.      SLP Plan  Continue with current plan of care     Recommendations  Diet recommendations: Dysphagia 2 (fine chop);Thin liquid Liquids provided via: Cup;Straw (monitor for coughing) Medication Administration: Crushed with puree Supervision: Patient able to self feed;Staff to assist with self feeding;Full supervision/cueing for compensatory strategies Compensations: Minimize environmental distractions;Slow rate;Small sips/bites;Lingual sweep for clearance of pocketing;Follow solids with liquid Postural Changes and/or Swallow Maneuvers: Seated upright 90 degrees;Upright 30-60 min after meal             General recommendations:  (dietician) Oral Care Recommendations: Oral care BID;Staff/trained caregiver to provide oral care Follow up Recommendations: None Plan: Continue with current plan of care     GO  Tricia Som, MS, CCC-SLP  Tricia Ruiz 07/29/2016, 4:24 PM

## 2016-07-29 NOTE — Progress Notes (Signed)
PT Cancellation Note  Patient Details Name: Tricia Ruiz MRN: [id number] DOB: 03/01/1918   Cancelled Treatment:    Reason Eval/Treat Not Completed: Medical issues which prohibited therapy.  Pt found to have B PE on CT angio chest earlier today.  Pt received therapeutic anticoagulation dose for PE at 1344 today.  Per PT protocol, will hold PT for 48 hours from administration of therapeutic anticoagulation dose for PE.   Irving Burtonmily Kataya Guimont 07/29/2016, 4:04 PM Hendricks LimesEmily Kaliopi Blyden, PT [phone]

## 2016-07-29 NOTE — Progress Notes (Signed)
Shift assessment completed at 0815. Pt resting with eyes closed and caregiver at bedside, per caregiver, pt awake a lot last night and just went to sleep. Pt is HOH, resistant to being uncovered for assessment. PIV #20 intact to l fa, site free of redness and swelling. Pt is alert to self only, pt received tramadol po as caregiver stated that pt takes this med regularly at home and has not received it, takes for chronic back pain. Since assessment, this Clinical research associatewriter spoke to Avery DennisonHeather, pt's granddaughter who stated she was a Engineer, civil (consulting)nurse. Herbert SetaHeather is requesting that a d dimer be performed on the pt due to pt's presenting hypoxia on admission. This Clinical research associatewriter explained that I would relay this request to the Dr., but that if pt has been having frequent falls at home, this test may be inconclusive for Pe.Pt's son visiting at that time and put this Clinical research associatewriter on the phone with Herbert SetaHeather. Dr. Jeri ModenaKassiletti then rounded on pt, and explained to son and caregiver at bedside that PE would not best be checked by d dimer due to kidney function and possible falls at home, Dr. Lenna Gilfordrdered a CTA. Pt went downstairs for CTA and was reportedly combative during this, attempting to kick and bite staff. Pt returned to the unit, initially received ativan 0.5 ivp but iv site appears to be leaking, although no swelling or discoloration present. This Haematologistwriter Called Dr. Lavada MesiKasiletti x2, to report results of Cta and to request ativan IM apporx 30 minutes after IV form given with no change in pt's level of agitation. Pt received ativan IM to R lateral thigh. Of note, pt's son expressed concern to Dr. Claiborne Billingshat pt has been awake at night and sleeping during the day since admission, and he wanted Dr. To change medications to cause pt to sleep at night and requested that staff "not bother" patient during the night to allow pt to sleep. This Clinical research associatewriter explained that there are certain established levels of care that need to be performed, but that staff could attempt to work around pt's  periods of wakefulness. Dr. Is willing to try medications to mitigate pt's possible "sundowning" and agitation level. Per caregiver at bedside, pt has changed her behavior significantly over the past 3-4 days at home.

## 2016-07-29 NOTE — Plan of Care (Signed)
Problem: Bowel/Gastric: Goal: Will not experience complications related to bowel motility Outcome: Progressing Pt has remained free of falls/injury this shift despite being combative and impulsive earlier, med changes in place to mediate this. Pt remains confused with private sitter at bedside, family members communicating with staff and doctor regarding pt plan of care.

## 2016-07-30 LAB — URINE CULTURE: Culture: >=100000 — AB

## 2016-07-30 MED ORDER — SODIUM CHLORIDE 0.9 % IV SOLN
1.0000 g | Freq: Four times a day (QID) | INTRAVENOUS | Status: DC
  Administered 2016-07-30 – 2016-08-01 (×8): 1 g via INTRAVENOUS
  Filled 2016-07-30 (×11): qty 1000

## 2016-07-30 NOTE — Progress Notes (Signed)
Pt's family member remains at bedside. Pt was able to void with bedpan for daughter, pt calm at this time. Per daughter, pt had a period of agitation approx an hour earlier, but has calmed now. This Clinical research associatewriter offered pain med, but daughter did not feel pt needed this at this time.

## 2016-07-30 NOTE — Progress Notes (Signed)
[facility] [place] at [facility]   PATIENT NAME: Tricia Ruiz    MR#:  [id number]  DATE OF BIRTH:  01-14-1918  SUBJECTIVE:  CHIEF COMPLAINT:   Chief Complaint  Patient presents with  . Back Pain  . Weakness   - sleeping today, arousable, pleasant. Did not receive any ativan today. - on oxygen, CT chest with PE  REVIEW OF SYSTEMS:  Review of Systems  Unable to perform ROS: Dementia    DRUG ALLERGIES:   Allergies  Allergen Reactions  . Codeine   . Morphine And Related Hives    VITALS:  Blood pressure (!) 129/49, pulse 96, temperature 97.9 F (36.6 C), temperature source Oral, resp. rate (!) 32, height 5\' 1"  (1.549 m), weight 46.8 kg (103 lb 3.2 oz), SpO2 100 %.  PHYSICAL EXAMINATION:  Physical Exam  GENERAL:  82 y.o.-year-old Elderly patient lying in the bed with no acute distress.  EYES: Pupils equal, round, reactive to light and accommodation. No scleral icterus. Extraocular muscles intact.  HEENT: Head atraumatic, normocephalic. Oropharynx and nasopharynx clear.  NECK:  Supple, no jugular venous distention. No thyroid enlargement, no tenderness.  LUNGS: Normal breath sounds bilaterally, no wheezing, rales,rhonchi or crepitation. No use of accessory muscles of respiration. Decreased bibasilar breath sounds CARDIOVASCULAR: S1, S2 normal. No rubs, or gallops. 2/6 systolic murmur is present ABDOMEN: Soft, nontender, nondistended. Bowel sounds present. No organomegaly or mass.  EXTREMITIES: No pedal edema, cyanosis, or clubbing.  NEUROLOGIC: Sleeping and unable to do a complete neuro exam at this time. arousable. PSYCHIATRIC: The patient is alert and oriented to self at baseline SKIN: No obvious rash, lesion, or ulcer.    LABORATORY PANEL:   CBC  Recent Labs Lab 07/28/16 0336  WBC 7.3  HGB 11.6*  HCT 33.1*  PLT 182    ------------------------------------------------------------------------------------------------------------------  Chemistries   Recent Labs Lab 07/27/16 1659  07/29/16 0336  NA 139  < > 140  K 4.3  < > 3.3*  CL 104  < > 108  CO2 27  < > 26  GLUCOSE 113*  < > 102*  BUN 26*  < > 15  CREATININE 0.88  < > 0.73  CALCIUM 9.2  < > 9.0  MG 2.0  --   --   AST 24  --   --   ALT 14  --   --   ALKPHOS 29*  --   --   BILITOT 0.5  --   --   < > = values in this interval not displayed. ------------------------------------------------------------------------------------------------------------------  Cardiac Enzymes  Recent Labs Lab 07/27/16 1659  TROPONINI <0.03   ------------------------------------------------------------------------------------------------------------------  RADIOLOGY:  Ct Angio Chest Pe W Or Wo Contrast  Result Date: 07/29/2016 CLINICAL DATA:  Dementia.  Hypoxia. EXAM: CT ANGIOGRAPHY CHEST WITH CONTRAST TECHNIQUE: Multidetector CT imaging of the chest was performed using the standard protocol during bolus administration of intravenous contrast. Multiplanar CT image reconstructions and MIPs were obtained to evaluate the vascular anatomy. CONTRAST:  75 cc Isovue 370 COMPARISON:  Radiography 07/27/2016. CT abdomen 07/27/2016. CT chest 09/15/2009 FINDINGS: Pulmonary arterial opacification is excellent. There are bilateral pulmonary emboli, primarily in the lower lobe branches. Go no evidence of right ventricular strain. No pericardial fluid. There is aortic atherosclerosis but no aneurysm or dissection. Small amount of pleural fluid in dependent atelectasis on the right. 7 mm nodule in the right middle lobe is unchanged since 2010 and conclusively benign. Scans in the upper abdomen do not show  any acute finding. Review of the MIP images confirms the above findings. IMPRESSION: The study is positive for pulmonary emboli primarily within the lower lobe branches bilaterally.  No evidence of right ventricular strain. Mild atelectasis at the right base with a small amount of pleural fluid. Chronic benign 7 mm nodule right middle lobe. Aortic atherosclerosis. These results will be called to the ordering clinician or representative by the Radiologist Assistant, and communication documented in the PACS or zVision Dashboard. Electronically Signed   By: Paulina FusiMark  Shogry M.D.   On: 07/29/2016 11:54    EKG:   Orders placed or performed during the hospital encounter of 07/27/16  . ED EKG  . ED EKG  . EKG 12-Lead  . EKG 12-Lead    ASSESSMENT AND PLAN:   82y/oF with PMH of dementia, hyperlipidemia brought in from home secondary to an episode of shortness of breath with hypoxia and progressive weakness.  #1 progressive weakness-could be from mild UTI. -Urine cultures are growing aerococcus. IV fluids, physical therapy consulted. -Antibiotics changed to ampicillin  #2 hypoxia- CT angiography the chest ordered that showed bilateral lower lobe emboli. -Started on eliquis.  -Continue oxygen and wean as tolerated. -Swallow eval done with aspiration precautions.. Diet with thin liquids.  #3 dementia and worsening periods of agitation. Also getting mixed up with days and nights. -Started on risperidone at bedtime. Ativan when necessary. On Klonopin low-dose. -Physical therapy when tolerated  #4 chronic back pain-on tramadol as needed.  #5 anxiety and sundowning-added Ativan and risperidone. Already on Klonopin at bedtime.  #6 DVT prophylaxis-on eliquis  Discussed with daughter, POA at bedside. Might need rehab    All the records are reviewed and case discussed with Care Management/Social Workerr. Management plans discussed with the patient, family and they are in agreement.  CODE STATUS: DO NOT RESUSCITATE  TOTAL TIME TAKING CARE OF THIS PATIENT: 45 minutes.   POSSIBLE D/C IN 1-2 DAYs, DEPENDING ON CLINICAL CONDITION.   Enid BaasKALISETTI,Kadia Abaya M.D on 07/30/2016 at 12:23  PM  Between 7am to 6pm - Pager - [phone]  After 6pm go to [email or web address] - Social research officer, governmentpassword EPAS [facility]  [facility]  Office  [phone]  CC: Primary care physician; Dale DurhamSCOTT, CHARLENE, MD

## 2016-07-30 NOTE — Discharge Instructions (Signed)
Information on my medicine - ELIQUIS (apixaban)  This medication education was reviewed with me or my healthcare representative as part of my discharge preparation.  The pharmacist that spoke with me during my hospital stay was:  Martyn MalayBarefoot,Talbert Trembath C, [facility] Hospital[facility]PH  Why was Eliquis prescribed for you? Eliquis was prescribed to treat blood clots that may have been found in the veins of your legs (deep vein thrombosis) or in your lungs (pulmonary embolism) and to reduce the risk of them occurring again.  What do You need to know about Eliquis ? The starting dose is 10 mg (two 5 mg tablets) taken TWICE daily for the FIRST SEVEN (7) DAYS, then on (enter date)  September 15th  the dose is reduced to ONE 5 mg tablet taken TWICE daily.  Eliquis may be taken with or without food.   Try to take the dose about the same time in the morning and in the evening. If you have difficulty swallowing the tablet whole please discuss with your pharmacist how to take the medication safely.  Take Eliquis exactly as prescribed and DO NOT stop taking Eliquis without talking to the doctor who prescribed the medication.  Stopping may increase your risk of developing a new blood clot.  Refill your prescription before you run out.  After discharge, you should have regular check-up appointments with your healthcare provider that is prescribing your Eliquis.    What do you do if you miss a dose? If a dose of ELIQUIS is not taken at the scheduled time, take it as soon as possible on the same day and twice-daily administration should be resumed. The dose should not be doubled to make up for a missed dose.  Important Safety Information A possible side effect of Eliquis is bleeding. You should call your healthcare provider right away if you experience any of the following: ? Bleeding from an injury or your nose that does not stop. ? Unusual colored urine (red or dark brown) or unusual colored stools (red or black). ? Unusual  bruising for unknown reasons. ? A serious fall or if you hit your head (even if there is no bleeding).  Some medicines may interact with Eliquis and might increase your risk of bleeding or clotting while on Eliquis. To help avoid this, consult your healthcare provider or pharmacist prior to using any new prescription or non-prescription medications, including herbals, vitamins, non-steroidal anti-inflammatory drugs (NSAIDs) and supplements.  This website has more information on Eliquis (apixaban): http://www.eliquis.com/eliquis/home

## 2016-07-30 NOTE — Plan of Care (Signed)
Problem: Bowel/Gastric: Goal: Will not experience complications related to bowel motility Outcome: Progressing Pt has not been evaluated by physical therapy yet per policy, will be evaluated tomorrow. Pt more cooperative but remains confused.

## 2016-07-30 NOTE — Progress Notes (Signed)
Shift assessment completed by 0800. Initially, pt was agitated for about an hour, making repeated attempts to get oob with bed alarm sounding, pt confused, stating she had to take care of her baby. Multiple staff reassured pt and pt calm at the time of assessment. Skin is pale, warm and ry. o2 on at [facility], respirations are shallow, lungs are clear/decreased bilat. S1S2 auscultated. Fingers are warm but this writer unable to get a reliable wave for for O2 sat, finally took sat of 100% from pt's forehead. Pt is hoh, tolerating a calm approach from staff. Abdomen is soft, bs heard. Pt is wearing incontinence brief.ppp, pulses are moderate. PIV #24 intact to R upper arm and wrapped in kling. Pt denied pain. Dr. Nemiah CommanderKalisetti in on rounds at this time.

## 2016-07-31 LAB — CBC
HEMATOCRIT: 34.8 % — AB (ref 35.0–47.0)
Hemoglobin: 12.1 g/dL (ref 12.0–16.0)
MCH: 30.9 pg (ref 26.0–34.0)
MCHC: 34.9 g/dL (ref 32.0–36.0)
MCV: 88.5 fL (ref 80.0–100.0)
PLATELETS: 233 10*3/uL (ref 150–440)
RBC: 3.93 MIL/uL (ref 3.80–5.20)
RDW: 13.2 % (ref 11.5–14.5)
WBC: 6.5 10*3/uL (ref 3.6–11.0)

## 2016-07-31 LAB — BASIC METABOLIC PANEL
Anion gap: 6 (ref 5–15)
BUN: 18 mg/dL (ref 6–20)
CO2: 27 mmol/L (ref 22–32)
CREATININE: 0.69 mg/dL (ref 0.44–1.00)
Calcium: 9.3 mg/dL (ref 8.9–10.3)
Chloride: 107 mmol/L (ref 101–111)
GFR calc Af Amer: >60 mL/min (ref >60)
GLUCOSE: 107 mg/dL — AB (ref 65–99)
POTASSIUM: 4.1 mmol/L (ref 3.5–5.1)
SODIUM: 140 mmol/L (ref 135–145)

## 2016-07-31 NOTE — Progress Notes (Signed)
Patient can work with physical therapy, diagnosed with PE- on eliquis for 2 days now (hasnt quite been 48hrs yet- almost 48hrs since anti coagulation). Can work with PT.

## 2016-07-31 NOTE — Progress Notes (Signed)
Shift assessment completed. Pt initially agitated at 0700, was attempting to get oob, redirected by staff multiple times, pt overall calm, not combative. Pt has calmed further at this time. [facility] at [facility], pt is hoh, buccal membranes are dry, pt answers some questions appropriately but is oriented only to herself. Skin is warm and ry, cap refill to fingers is slow, o2 sat via forehead for this reason. PIV #24 intact to RUA and wrapped in kling. Lungs are decreased throughout, respirations are shallow but regular. S1S2 auscultated, abdomen is soft, bs heard. Pt is wearing incontinence brief, ppp, no edema noted. Pt is in no distress, Dr. Nemiah CommanderKalisetti in on rounds at 0730.

## 2016-07-31 NOTE — Plan of Care (Signed)
Problem: Bowel/Gastric: Goal: Will not experience complications related to bowel motility Outcome: Progressing Pt is progressing toward goals, anticipated d/c tomorrow. Pt has remained free of falls/injury, walked with physical therapy.

## 2016-07-31 NOTE — Progress Notes (Signed)
Bladder scan 432 ml. MD notified. Dr. Sheryle Hailiamond aware ordered x1 I/O cath. Prior to insertion of cath patient voided large amount of urine. Nursing will continue to monitor.

## 2016-07-31 NOTE — Progress Notes (Signed)
Physical Therapy Treatment Patient Details Name: Tricia Ruiz MRN: [id number] DOB: 08-23-1918 Today's Date: 07/31/2016    History of Present Illness Pt is a 82 y.o. female presenting to hospital with SOB, chest pain, and hypoxia.  Pt admitted with generalized weakness, potential UTI, hypoxia, and increased confusion.  PMH includes rapidly declining Alzheimer's dementia, chronic LBP, TIA, and B TKR.    PT Comments    Pt pleasantly confused t/o session, shows decent ability to follow simple instructions t/o the effort but does need very frequent/regular cuing t/o ambulation.  Pt does not have excessive fatigue with ~200 ft of ambulation but at times appeared to need to stop/rest.  She has no overt LOBs, but had some general unsteadiness and did do better with the walker (as opposed to no UE assist and single HHA).  Pt will need 24 hr assist at home, but this appears to be the most appropriate situation on discharge at this time.   Follow Up Recommendations  Home health PT; 24 hour supervision     Equipment Recommendations   (pt has walker at home, getting used to using it)    Recommendations for Other Services       Precautions / Restrictions Restrictions Weight Bearing Restrictions: No    Mobility  Bed Mobility Overal bed mobility: Needs Assistance Bed Mobility: Supine to Sit;Sit to Supine     Supine to sit: Min assist Sit to supine: Min assist   General bed mobility comments: Pt is able to assist with getting to EOB, family member providing the assist, reports she is the level that she recently has been   Transfers Overall transfer level: Modified independent Equipment used: 1 person hand held assist             General transfer comment: Pt shows good effort in getting to standing, needs light assist (seems more from mental status than physical ability)  Ambulation/Gait Ambulation/Gait assistance: Min guard Ambulation Distance (Feet): 200 Feet Assistive device:  Rolling walker (2 wheeled);1 person hand held assist       General Gait Details: Pt with slow, inconsistent gait but has no LOBs.  PT/family close the entire time with occasional work with HHA, walker and no UE use.  Pt's mental status is her biggest limiter during the effort though she does have some unsteadiness w/o LOBs.  Pt was safest with RW, family reports PT at home had been working on getting her more comfortable with it.   Stairs            Wheelchair Mobility    Modified Rankin (Stroke Patients Only)       Balance Overall balance assessment: Needs assistance   Sitting balance-Leahy Scale: Good       Standing balance-Leahy Scale: Fair                      Cognition Arousal/Alertness:  (confused, awake) Behavior During Therapy: WFL for tasks assessed/performed Overall Cognitive Status: History of cognitive impairments - at baseline                      Exercises      General Comments        Pertinent Vitals/Pain Pain Assessment: No/denies pain    Home Living                      Prior Function            PT  Goals (current goals can now be found in the care plan section) Progress towards PT goals: Progressing toward goals    Frequency  Min 2X/week    PT Plan Current plan remains appropriate    Co-evaluation             End of Session Equipment Utilized During Treatment: Gait belt Activity Tolerance:  (limited secondary to confusion) Patient left: with bed alarm set;with family/visitor present;with call bell/phone within reach     Time: 1218-1234 PT Time Calculation (min) (ACUTE ONLY): 16 min  Charges:  $Gait Training: 8-22 mins                    G Codes:      Malachi Pro, DPT 07/31/2016, 1:14 PM

## 2016-07-31 NOTE — Progress Notes (Signed)
[facility] [place] at [facility]   PATIENT NAME: Tricia Ruiz    MR#:  [id number]  DATE OF BIRTH:  07-23-1918  SUBJECTIVE:  CHIEF COMPLAINT:   Chief Complaint  Patient presents with  . Back Pain  . Weakness   - more alert today and trying to answer questions, pleasantly confused - trying to get out of bed, but easily reoriented  REVIEW OF SYSTEMS:  Review of Systems  Unable to perform ROS: Dementia    DRUG ALLERGIES:   Allergies  Allergen Reactions  . Codeine   . Morphine And Related Hives    VITALS:  Blood pressure (!) 111/57, pulse (!) 56, temperature 97.8 F (36.6 C), temperature source Oral, resp. rate 16, height 5\' 1"  (1.549 m), weight 46.8 kg (103 lb 3.2 oz), SpO2 100 %.  PHYSICAL EXAMINATION:  Physical Exam  GENERAL:  82 y.o.-year-old Elderly patient lying in the bed with no acute distress.  EYES: Pupils equal, round, reactive to light and accommodation. No scleral icterus. Extraocular muscles intact.  HEENT: Head atraumatic, normocephalic. Oropharynx and nasopharynx clear.  NECK:  Supple, no jugular venous distention. No thyroid enlargement, no tenderness.  LUNGS: Normal breath sounds bilaterally, no wheezing, rales,rhonchi or crepitation. No use of accessory muscles of respiration. Decreased bibasilar breath sounds CARDIOVASCULAR: S1, S2 normal. No rubs, or gallops. 2/6 systolic murmur is present ABDOMEN: Soft, nontender, nondistended. Bowel sounds present. No organomegaly or mass.  EXTREMITIES: No pedal edema, cyanosis, or clubbing.  NEUROLOGIC: unable to do a complete neuro exam at this time. Alert and not following commands, moving all extremities in bed PSYCHIATRIC: The patient is alert and oriented to self at baseline SKIN: No obvious rash, lesion, or ulcer.    LABORATORY PANEL:   CBC  Recent Labs Lab 07/28/16 0336  WBC 7.3  HGB 11.6*  HCT 33.1*  PLT 182    ------------------------------------------------------------------------------------------------------------------  Chemistries   Recent Labs Lab 07/27/16 1659  07/31/16 0609  NA 139  < > 140  K 4.3  < > 4.1  CL 104  < > 107  CO2 27  < > 27  GLUCOSE 113*  < > 107*  BUN 26*  < > 18  CREATININE 0.88  < > 0.69  CALCIUM 9.2  < > 9.3  MG 2.0  --   --   AST 24  --   --   ALT 14  --   --   ALKPHOS 29*  --   --   BILITOT 0.5  --   --   < > = values in this interval not displayed. ------------------------------------------------------------------------------------------------------------------  Cardiac Enzymes  Recent Labs Lab 07/27/16 1659  TROPONINI <0.03   ------------------------------------------------------------------------------------------------------------------  RADIOLOGY:  Ct Angio Chest Pe W Or Wo Contrast  Result Date: 07/29/2016 CLINICAL DATA:  Dementia.  Hypoxia. EXAM: CT ANGIOGRAPHY CHEST WITH CONTRAST TECHNIQUE: Multidetector CT imaging of the chest was performed using the standard protocol during bolus administration of intravenous contrast. Multiplanar CT image reconstructions and MIPs were obtained to evaluate the vascular anatomy. CONTRAST:  75 cc Isovue 370 COMPARISON:  Radiography 07/27/2016. CT abdomen 07/27/2016. CT chest 09/15/2009 FINDINGS: Pulmonary arterial opacification is excellent. There are bilateral pulmonary emboli, primarily in the lower lobe branches. Go no evidence of right ventricular strain. No pericardial fluid. There is aortic atherosclerosis but no aneurysm or dissection. Small amount of pleural fluid in dependent atelectasis on the right. 7 mm nodule in the right middle lobe is unchanged since 2010 and  conclusively benign. Scans in the upper abdomen do not show any acute finding. Review of the MIP images confirms the above findings. IMPRESSION: The study is positive for pulmonary emboli primarily within the lower lobe branches bilaterally. No  evidence of right ventricular strain. Mild atelectasis at the right base with a small amount of pleural fluid. Chronic benign 7 mm nodule right middle lobe. Aortic atherosclerosis. These results will be called to the ordering clinician or representative by the Radiologist Assistant, and communication documented in the PACS or zVision Dashboard. Electronically Signed   By: Paulina FusiMark  Shogry M.D.   On: 07/29/2016 11:54    EKG:   Orders placed or performed during the hospital encounter of 07/27/16  . ED EKG  . ED EKG  . EKG 12-Lead  . EKG 12-Lead    ASSESSMENT AND PLAN:   89y/oF with PMH of dementia, hyperlipidemia brought in from home secondary to an episode of shortness of breath with hypoxia and progressive weakness.  #1 progressive weakness-could be from mild UTI. -Urine cultures are growing aerococcus. physical therapy consulted. -Antibiotics changed to ampicillin  #2 hypoxia- CT angiography the chest ordered that showed bilateral lower lobe emboli. -Started on eliquis.  -Continue oxygen and wean as tolerated. -Swallow eval done with aspiration precautions.. Diet with thin liquids.  #3 dementia and worsening periods of agitation. Also getting mixed up with days and nights. -Started on risperidone at bedtime. Ativan when necessary. On Klonopin low-dose. -Physical therapy when tolerated  #4 chronic back pain-on tramadol as needed.  #5 anxiety and sundowning-Ativan and risperidone. Already on Klonopin at bedtime.  #6 DVT prophylaxis-on eliquis  Might need rehab, PT eval pending    All the records are reviewed and case discussed with Care Management/Social Workerr. Management plans discussed with the patient, family and they are in agreement.  CODE STATUS: DO NOT RESUSCITATE  TOTAL TIME TAKING CARE OF THIS PATIENT: 45 minutes.   POSSIBLE D/C IN 1-2 DAYs, DEPENDING ON CLINICAL CONDITION.   Enid BaasKALISETTI,Doloras [place]ado M.D on 07/31/2016 at 10:08 AM  Between 7am to 6pm - Pager -  [phone]  After 6pm go to [email or web address] - Social research officer, governmentpassword EPAS [facility]  [facility] [place] [facility]  Office  [phone]  CC: Primary care physician; Dale DurhamSCOTT, CHARLENE, MD

## 2016-07-31 NOTE — Progress Notes (Signed)
Pt is resting in bed with caregiver at bedside. Pt has been evaluated by physical therapy, however, palliative care consult is pending for d/c, no anticipated for tomorrow. Pt has tolerated staff interventions well.

## 2016-08-01 ENCOUNTER — Telehealth: Payer: Self-pay | Admitting: *Deleted

## 2016-08-01 DIAGNOSIS — R0902 Hypoxemia: Secondary | ICD-10-CM

## 2016-08-01 MED ORDER — TRAMADOL HCL 50 MG PO TABS: 50.0000 mg | ORAL_TABLET | Freq: Three times a day (TID) | ORAL | 0 refills | Status: AC | PRN

## 2016-08-01 MED ORDER — AMOXICILLIN 500 MG PO TABS: 500.0000 mg | ORAL_TABLET | Freq: Two times a day (BID) | ORAL | 0 refills | Status: AC

## 2016-08-01 MED ORDER — RISPERIDONE 0.5 MG PO TBDP: 0.5000 mg | ORAL_TABLET | Freq: Every day | ORAL | 0 refills | Status: AC

## 2016-08-01 MED ORDER — APIXABAN 5 MG PO TABS: 5.0000 mg | ORAL_TABLET | Freq: Two times a day (BID) | ORAL | 1 refills | Status: AC

## 2016-08-01 MED ORDER — APIXABAN 5 MG PO TABS: 10.0000 mg | ORAL_TABLET | Freq: Two times a day (BID) | ORAL | 0 refills | Status: AC

## 2016-08-01 MED ORDER — ACETAMINOPHEN 325 MG PO TABS: 650.0000 mg | ORAL_TABLET | Freq: Four times a day (QID) | ORAL | 0 refills | Status: AC | PRN

## 2016-08-01 MED ORDER — LORAZEPAM 0.5 MG PO TABS: 0.5000 mg | ORAL_TABLET | Freq: Three times a day (TID) | ORAL | 0 refills | Status: AC | PRN

## 2016-08-01 NOTE — Care Management Important Message (Signed)
Important Message  Patient Details  Name: Claudia DesanctisJolene C Roser MRN: [id number] Date of Birth: 1918/07/17   Medicare Important Message Given:  Yes    Marily MemosLisa M Bentley Haralson, RN 08/01/2016, 8:59 AM

## 2016-08-01 NOTE — Discharge Summary (Signed)
[facility] - [place] at [facility]   PATIENT NAME: Tricia Ruiz    MR#:  [id number]  DATE OF BIRTH:  10-24-1918  DATE OF ADMISSION:  07/27/2016   ADMITTING PHYSICIAN: Oralia Manis, MD  DATE OF DISCHARGE: 08/01/2016  PRIMARY CARE PHYSICIAN: Dale Sasakwa, MD   ADMISSION DIAGNOSIS:   Weakness [R53.1] Exertional dyspnea [R06.09] Acute respiratory failure with hypoxia (HCC) [J96.01]  DISCHARGE DIAGNOSIS:   Principal Problem:   Hypoxia Active Problems:   Chronic back pain   Dementia in Alzheimer's disease   Weakness   UTI (lower urinary tract infection)   Protein-calorie malnutrition, severe   Pulmonary embolism (HCC)   SECONDARY DIAGNOSIS:   Past Medical History:  Diagnosis Date  . Arthritis   . Hypercholesterolemia   . TIA (transient ischemic attack)     HOSPITAL COURSE:   82y/oF with PMH of dementia, hyperlipidemia brought in from home secondary to an episode of shortness of breath with hypoxia and progressive weakness.  #1 progressive weakness-could be from UTI. Also PE, hypoxia on admission -Urine cultures are growing aerococcus. physical therapy consulted. -Antibiotics changed to amoxicillin at discharge  #2 hypoxia- CT angiography the chest ordered that showed bilateral lower lobe emboli. -Started on eliquis.  -off oxygen now, might need on exertion- will be discharged with hospice services -Swallow eval done with aspiration precautions.. Diet with thin liquids.  #3 dementia and worsening periods of agitation. Also getting mixed up with days and nights. - Much improved and pleasant now -on risperidone at bedtime. Ativan when necessary. On Klonopin low-dose at bedtime as well -Physical therapy - patient did well and walked better Will be discharged home with hospice  #4 chronic back pain-on tramadol as needed.  #5 anxiety and sundowning-Ativan and risperidone. Already on Klonopin at bedtime.  Will be discharged home  today   DISCHARGE CONDITIONS:   Guarded CONSULTS OBTAINED:   Palliative care consultation  DRUG ALLERGIES:   Allergies  Allergen Reactions  . Codeine   . Morphine And Related Hives   DISCHARGE MEDICATIONS:     Medication List    STOP taking these medications   acetaminophen 650 MG CR tablet Commonly known as:  TYLENOL Replaced by:  acetaminophen 325 MG tablet     TAKE these medications   acetaminophen 325 MG tablet Commonly known as:  TYLENOL Take 2 tablets (650 mg total) by mouth every 6 (six) hours as needed for mild pain, fever or headache (or Fever >/= 101). Replaces:  acetaminophen 650 MG CR tablet   amoxicillin 500 MG tablet Commonly known as:  AMOXIL Take 1 tablet (500 mg total) by mouth 2 (two) times daily. X 5 more days   apixaban 5 MG Tabs tablet Commonly known as:  ELIQUIS Take 2 tablets (10 mg total) by mouth 2 (two) times daily. X 5 more days followed by 1tab of 5mg  twice a day   apixaban 5 MG Tabs tablet Commonly known as:  ELIQUIS Take 1 tablet (5 mg total) by mouth 2 (two) times daily. Please take this after the first week of 2 tabs twice a day dosing Start taking on:  08/05/2016   clonazePAM 0.5 MG tablet Commonly known as:  KLONOPIN Take 0.5 mg by mouth at bedtime.   LORazepam 0.5 MG tablet Commonly known as:  ATIVAN Take 1 tablet (0.5 mg total) by mouth every 8 (eight) hours as needed for anxiety (agitation).   risperiDONE 0.5 MG disintegrating tablet Commonly known as:  RISPERDAL M-TABS Take 1  tablet (0.5 mg total) by mouth at bedtime.   STOOL SOFTENER PO Take 100 mg by mouth 2 (two) times daily.   traMADol 50 MG tablet Commonly known as:  ULTRAM Take 1 tablet (50 mg total) by mouth 3 (three) times daily as needed. What changed:  how much to take        DISCHARGE INSTRUCTIONS:   1. Home hospice services 2. PCP f/u in 1-2 weeks  DIET:   Regular diet  ACTIVITY:   Activity as tolerated  OXYGEN:   Home Oxygen: No.   Oxygen Delivery: room air  DISCHARGE LOCATION:   home with hospice services  If you experience worsening of your admission symptoms, develop shortness of breath, life threatening emergency, suicidal or homicidal thoughts you must seek medical attention immediately by calling 911 or calling your MD immediately  if symptoms less severe.  You Must read complete instructions/literature along with all the possible adverse reactions/side effects for all the Medicines you take and that have been prescribed to you. Take any new Medicines after you have completely understood and accpet all the possible adverse reactions/side effects.   Please note  You were cared for by a hospitalist during your hospital stay. If you have any questions about your discharge medications or the care you received while you were in the hospital after you are discharged, you can call the unit and asked to speak with the hospitalist on call if the hospitalist that took care of you is not available. Once you are discharged, your primary care physician will handle any further medical issues. Please note that NO REFILLS for any discharge medications will be authorized once you are discharged, as it is imperative that you return to your primary care physician (or establish a relationship with a primary care physician if you do not have one) for your aftercare needs so that they can reassess your need for medications and monitor your lab values.    On the day of Discharge:  VITAL SIGNS:   Blood pressure (!) 126/104, pulse 77, temperature 97.8 F (36.6 C), temperature source Oral, resp. rate 16, height 5\' 1"  (1.549 m), weight 46.8 kg (103 lb 3.2 oz), SpO2 99 %.  PHYSICAL EXAMINATION:    GENERAL:  82 y.o.-year-old Elderly patient lying in the bed with no acute distress. Pleasantly confused this morning  EYES: Pupils equal, round, reactive to light and accommodation. No scleral icterus. Extraocular muscles intact.  HEENT: Head  atraumatic, normocephalic. Oropharynx and nasopharynx clear.  NECK:  Supple, no jugular venous distention. No thyroid enlargement, no tenderness.  LUNGS: Normal breath sounds bilaterally, no wheezing, rales,rhonchi or crepitation. No use of accessory muscles of respiration. Decreased bibasilar breath sounds CARDIOVASCULAR: S1, S2 normal. No rubs, or gallops. 2/6 systolic murmur is present ABDOMEN: Soft, nontender, nondistended. Bowel sounds present. No organomegaly or mass.  EXTREMITIES: No pedal edema, cyanosis, or clubbing.  NEUROLOGIC: unable to do a complete neuro exam at this time. Alert and not following commands, confused conversation. Smiling inappropriately. moving all extremities in bed and walk PSYCHIATRIC: The patient is alert and oriented to self at baseline SKIN: No obvious rash, lesion, or ulcer.   DATA REVIEW:   CBC  Recent Labs Lab 07/31/16 0609  WBC 6.5  HGB 12.1  HCT 34.8*  PLT 233    Chemistries   Recent Labs Lab 07/27/16 1659  07/31/16 0609  NA 139  < > 140  K 4.3  < > 4.1  CL 104  < >  107  CO2 27  < > 27  GLUCOSE 113*  < > 107*  BUN 26*  < > 18  CREATININE 0.88  < > 0.69  CALCIUM 9.2  < > 9.3  MG 2.0  --   --   AST 24  --   --   ALT 14  --   --   ALKPHOS 29*  --   --   BILITOT 0.5  --   --   < > = values in this interval not displayed.   Microbiology Results  Results for orders placed or performed during the hospital encounter of 07/27/16  Urine culture     Status: Abnormal   Collection Time: 07/27/16  8:55 PM  Result Value Ref Range Status   Specimen Description URINE, RANDOM  Final   Special Requests NONE  Final   Culture >=100,000 COLONIES/mL AEROCOCCUS URINAE (A)  Final   Report Status 07/30/2016 FINAL  Final    RADIOLOGY:  No results found.   Management plans discussed with the patient, family and they are in agreement.  CODE STATUS:     Code Status Orders        Start     Ordered   07/28/16 0048  Do not attempt  resuscitation (DNR)  Continuous    Question Answer Comment  In the event of cardiac or respiratory ARREST Do not call a "code blue"   In the event of cardiac or respiratory ARREST Do not perform Intubation, CPR, defibrillation or ACLS   In the event of cardiac or respiratory ARREST Use medication by any route, position, wound care, and other measures to relive pain and suffering. May use oxygen, suction and manual treatment of airway obstruction as needed for comfort.      07/28/16 0047    Code Status History    Date Active Date Inactive Code Status Order ID Comments User Context   This patient has a current code status but no historical code status.    Advance Directive Documentation   Flowsheet Row Most Recent Value  Type of Advance Directive  Healthcare Power of Attorney, Living will  Pre-existing out of facility DNR order (yellow form or pink MOST form)  No data  "MOST" Form in Place?  No data      TOTAL TIME TAKING CARE OF THIS PATIENT: 38 minutes.    Enid Baas M.D on 08/01/2016 at 11:36 AM  Between 7am to 6pm - Pager - [phone]  After 6pm go to [email or web address] - Social research officer, government  [facility] [place] [facility]  Office  [phone]  CC: Primary care physician; Dale [place], MD   Note: This dictation was prepared with Dragon dictation along with smaller phrase technology. Any transcriptional errors that result from this process are unintentional.

## 2016-08-01 NOTE — Care Management (Addendum)
Spoke with Tricia Ruiz, HCPOASheryle Hail. She wants to seek [facility] care at home. Dayna BarkerKaren Robertson made aware. Bed with be delivered prior to discharge.

## 2016-08-01 NOTE — Consult Note (Signed)
   [facility] CM Inpatient Consult   08/01/2016  Tricia DesanctisJolene C Ruiz Dec 27, 1917 [id number]   Patient screened for potential [facility] Care Management services. Patient is eligible for [facility] Services. Electronic medical record reveals patient's discharge plan is to [facility] not appropriate at this time. If patient's post hospital needs change please place a [facility] consult. For questions please contact:   Sesilia Poucher RN, BSN [facility]  Hospital Liaison  [phone]) Business Mobile [phone]) Toll free office

## 2016-08-01 NOTE — Telephone Encounter (Signed)
Please make Dr.Scott aware.

## 2016-08-01 NOTE — Plan of Care (Signed)
Problem: Bowel/Gastric: Goal: Will not experience complications related to bowel motility Outcome: Adequate for Discharge Pt is discharged.   

## 2016-08-01 NOTE — Progress Notes (Signed)
ANTICOAGULATION CONSULT NOTE - Follow up Consult  Pharmacy Consult for apixaban Indication: pulmonary embolus  Allergies  Allergen Reactions  . Codeine   . Morphine And Related Hives    Patient Measurements: Height: 5\' 1"  (154.9 cm) Weight: 103 lb 3.2 oz (46.8 kg) IBW/kg (Calculated) : 47.8  Vital Signs: Temp: 97.8 F (36.6 C) (09/11 0405) Temp Source: Oral (09/11 0405) BP: 126/104 (09/11 0834) Pulse Rate: 77 (09/11 0834)  Labs:  Recent Labs  07/31/16 0609  HGB 12.1  HCT 34.8*  PLT 233  CREATININE 0.69    Estimated Creatinine Clearance: 30.4 mL/min (by C-G formula based on SCr of 0.8 mg/dL).   Medical History: Past Medical History:  Diagnosis Date  . Arthritis   . Hypercholesterolemia   . TIA (transient ischemic attack)    Assessment: Pharmacy consulted to dose apixaban for acute PE in this 82 year old female   Plan:  Apixaban 10mg  PO BID x 7 days, followed by 5mg  PO BID SCr and CBC every 3 days   Crist FatWang, Bolu[place]ife Flight L 08/01/2016,9:57 AM

## 2016-08-01 NOTE — Telephone Encounter (Signed)
[facility] Lorin PicketScott that they will start the palliative care orders this week. Patient will discharge from the hospital on 09/12.  Stanton KidneyDebra [phone]

## 2016-08-01 NOTE — Progress Notes (Signed)
New referral for [facility] at home following discharge received from [facility] Lisa Jacobs. Ms. Ronelle NighMaclean is a 82 year old woman with a history of Alzheimer's dementia, HLD and TIA's,  [facility] for evaluation of hypoxia/dyspnea and weakness. She had a chest CT on 9/8 that revealed bilateral pulmonary emboli and she was started on Eliquis. Per chart note review she has had a 10 pound weight loss since May 2017 as well as recurrent falls at home. BMI 19, with moderate to severe muscle wasting.  She is incontinent of bowel and bladder. Patient seen lying in bed, alert, pleasantly confused, oriented to self only, care giver Marcelino DusterMichelle at bedside. Writer spoke on the phone with patien's HCPOA and daughter in law Bernadene PersonVicky Farquharson who is familiar with hospice services. [name] has requested a hospital bed with rails and an over bed table. DME has been requested for delivery as soon as possible, Family requests bed to be in place prior to patient's arrival home, plan is for discharge home via car today. Patient information faxed to hospice referral. Hospital care team aware. Thank you.  Dayna BarkerKaren Robertson RN, BSN, [facility], [facility] Liaison [phone]

## 2016-08-01 NOTE — Telephone Encounter (Signed)
FYI

## 2016-08-01 NOTE — Progress Notes (Signed)
Pt dc'd via WC to front entrance of facility at 1535. Pt's POA [name] arrived at bedside, all d/c instructions were reviewed with her and caregiver, scripts for ativan, risperdol, and tramadol given to her. [name] signed and received copy of instructions.

## 2016-08-01 NOTE — Progress Notes (Signed)
  I spoke with daughter in law/HPOA [facility]Vicky Mac Lean regarding this patient .  They are interested in home hospice services if eligible.  Per family patient  has continued physical, functional and cognitive decline.  Poor po intake, now requires feeding with 10 pound weight loss in the past two months.  Mostly bed bound.  H/O falls prior to this admission.  Focus of care is comfort.  Will place order for hospice referral and evaluation.  Lorinda CreedMary Larach NP  Palliative Medicine Team Team Phone # [phone] Pager [phone]

## 2016-08-01 NOTE — Progress Notes (Signed)
Shift assessment completed at 0830. Pt was awakened at that time, is calm and cooperative, oriented only to herself. See flowsheet for assessment. This Clinical research associatewriter assissted pt with breakfast and am meds. Caregiver has arrived at bedside, anticipated d/c today. Dr. Nemiah CommanderKalisetti has also rounded on pt. Pt is sitting up in bed and watching tv, talking to care giver. Srx2, call bell in reach. PIV to RUA remains intact.

## 2016-08-01 NOTE — Progress Notes (Signed)
Palliative Medicine consult noted.   However, note from hospice liason indicates that hospice is involved and ready to admit at d/c.  If PMT consult is still needed, please expect a delay, as there is high referral volume. Please call the Palliative Medicine Team office at [phone] if recommendations are needed in the interim.  Thank you for inviting us to see this patient.  Margret ChanceMelanie G. Mercede Rollo, RN, BSN, [facility] 08/01/2016 2:29 PM Cell [phone] 8:00-4:00 Monday-Friday Office [phone]

## 2016-08-03 NOTE — Progress Notes (Signed)
Speech Language Pathology Treatment: Dysphagia  Patient Details Name: Tricia Ruiz MRN: [id number] DOB: [birth year] Today's Date: 08/03/2016 Time: 4098-1191 SLP Time Calculation (min) (ACUTE ONLY): 35 min  Assessment / Plan / Recommendation Clinical Impression  This is a late tx note entry. Pt appears at reduced risk for aspiration following general aspiration precautions during oral intake following aspiration precautions and w/ increased support w/ feeding. However, d/t declined Cognitive status/Dementia, pt can have increased risk for aspiration in general d/t decreased awareness as well as need for feeding assistance at meals. Pt accepted sips of thin liquids and appeared to tolerate w/ no immediate, overt s/s of aspiration noted; no decline in respiratory status or change in vocal quality during/post po trials. Pt does require full feeding assistance w/ reduced distractions in her environment during any intake to allow pt to focus on task of eating/drinking - feeding assistance appears necessary from this point on to reduce demand/stress on pt during meals. Recommend continue a Dysphagia 2 diet consistency WITH ADDED PUREES in meal w/ thin liquids for safe oral intake and to adequately meet nutritional needs. Recommend meds in Puree; aspiration precautions; feeding assistance at all meals. ST services to f/u w/ toleration of diet - this may be an easier, safer food consistency for pt to eat w/ her advanced age. Thoroughly discussed aspiration precautions and suggestions of facilitation and support during the meal; food consistencies; environmental controls to support pt w/ po's. NSG staff updated   HPI HPI: Pt is a 82 y.o. female w/ h/o Alzhiemer's Dementia who presents with an episode of hypoxia as well as progressive weakness. Patient is unable to contribute to the history due to confusion, her daughter who is at bedside with her providing most of the history. She states that for the past couple  of days the patient has become more confused, and that for the past 1-2 days she has been unable to control her urine. She also states that the patient's episode of hypoxia was while she was working with PT this morning. Here in the ED she was found to have too numerous to count red cells in urine with 6-30 white blood cells. This is somewhat indeterminate for UTI but the patient does have a history of UTIs. Pt's caregiver and Daughter describe behavioral issuse when eating stating pt often "puts too much food in her mouth at one time then begins throat clearing"; "she doesn't anything until the end of the meal"; "she forgets how to drink from a straw"; "she has a hard time feeding herself and spills food". No immediate reports of aspiration; food is cut small during meals. Pt does have baseline Dementia w/ caregivers at the home w/ the Daughter.      SLP Plan  Continue with current plan of care     Recommendations  Diet recommendations: Dysphagia 1 (puree);Dysphagia 2 (fine chop);Thin liquid Liquids provided via: Cup;Straw Medication Administration: Crushed with puree Supervision: Patient able to self feed;Staff to assist with self feeding;Full supervision/cueing for compensatory strategies Compensations: Minimize environmental distractions;Slow rate;Small sips/bites;Lingual sweep for clearance of pocketing;Follow solids with liquid Postural Changes and/or Swallow Maneuvers: Seated upright 90 degrees;Upright 30-60 min after meal             General recommendations:  (none) Oral Care Recommendations: Oral care BID;Staff/trained caregiver to provide oral care Follow up Recommendations: None Plan: Continue with current plan of care     GO  Tricia SomKatherine Watson, MS, CCC-SLP  Ruiz,Tricia 08/03/2016, 8:24 AM

## 2016-08-08 ENCOUNTER — Telehealth: Payer: Self-pay | Admitting: *Deleted

## 2016-08-08 NOTE — Telephone Encounter (Signed)
[name] has requested an update on a form for skin care and protocol,this form needs to signed.  contact [phone]

## 2016-08-08 NOTE — Telephone Encounter (Signed)
Please advise if this form was received in Dr. Roby LoftsScott's mailbox and if it was return yet. thanks

## 2016-08-08 NOTE — Telephone Encounter (Signed)
Did you receive this?

## 2016-08-08 NOTE — Telephone Encounter (Signed)
Form signed and placed in your box.

## 2016-08-08 NOTE — Telephone Encounter (Signed)
faxed

## 2016-08-11 ENCOUNTER — Telehealth: Payer: Self-pay | Admitting: *Deleted

## 2016-08-11 NOTE — Telephone Encounter (Signed)
Spoke with [employer] International CorporationStephanie. She states it is a ongoing issue with this patient and dome days are worse then others.  Normally around 2-4 in the afternoon patient starts to 'sundown' and normally the meds can help.  Yesterday they gave the Lorazepam early and it didn't help, patient has been awake all night not and still going.  Please advise what we can do, thanks

## 2016-08-11 NOTE — Telephone Encounter (Signed)
Stephanie from hospice stated that pt's family reported pt being highly aggravated through out the night. Medications administered by family were risperidone, klonopin and  Lorazepam as directed, in which none helped her through out the night. Family requested a medication adjustment . Please contact [name] with Hospice [phone]

## 2016-08-12 NOTE — Telephone Encounter (Signed)
Stephanie called Dr. Lorin PicketScott back. Dr. Lorin PicketScott please call her at [phone].

## 2016-08-12 NOTE — Telephone Encounter (Signed)
Talked with [name] about Ms Tricia Ruiz.  Discussed medication options.  She is going to contact Dr [facility]od[facility](Hospice MD) regarding medication recommendations.  Will d/c clonazepam.  Continue lorazepam prn and risperdol.  Await return phone call for other recommendations.

## 2016-08-12 NOTE — Telephone Encounter (Signed)
Please advise, thanks.

## 2016-08-12 NOTE — Telephone Encounter (Signed)
Tricia CornfieldStephanie talked with Dr Yetta FlockHodges (hospice MD).  Recommended stopping lorazepam and use clonazepam q 8 hours prn during the day and continue the qhs.  Also recommended risperdol 1mg  bid.  Will follow and notify me if persistent problems.

## 2016-08-24 ENCOUNTER — Telehealth: Payer: Self-pay | Admitting: Internal Medicine

## 2016-08-24 NOTE — Telephone Encounter (Signed)
Order given to stop risperdal and start seroquel 50 bid.  They will call in rx.

## 2016-08-24 NOTE — Telephone Encounter (Signed)
Please advise 

## 2016-08-24 NOTE — Telephone Encounter (Signed)
[name] from [facility] [place] called and was wondering if we could do a medication change the risperiDONE (RISPERDAL M-TABS) 0.5 MG disintegrating tablet to Seroquel 50 mg 2x daily. Please Advise, thankyou!  Call [phone]

## 2016-09-06 ENCOUNTER — Telehealth: Payer: Self-pay | Admitting: *Deleted

## 2016-09-06 NOTE — Telephone Encounter (Signed)
Yes.  Dukes Magic Mouthwash 5 cc's swish and spit tid.

## 2016-09-06 NOTE — Telephone Encounter (Signed)
Stephanie from [facility] reported that, Pt dentures do not fit, due to weight loss. Pt currently has a few rotten teeth that are causing discomfort, the  Hospice Nurse  stated that family  requested an antibiotic to help with swelling and discomfort.  [facility] [place]  Contact Stephanie [phone]

## 2016-09-06 NOTE — Telephone Encounter (Signed)
Please call hospice nurse and see if she feels pt needs an antibiotic.  If so, I can send in abx.  Does she think Dukes Mouthwash - better?  Just let me know from there assessment what they feels she needs.

## 2016-09-06 NOTE — Telephone Encounter (Signed)
Left message to return call 

## 2016-09-06 NOTE — Telephone Encounter (Signed)
Patients nurse stats=es her gums are very swollen and tender and may benefit from mouth wash and if no improvement could try antibiotics, is this ok?

## 2016-10-21 ENCOUNTER — Other Ambulatory Visit: Payer: Self-pay | Admitting: Internal Medicine

## 2016-10-21 IMAGING — US US RETROPERITONEAL COMPLETE
1 series · 13 of 25 positions shown · non-contrast
Comparison: 07/03/2013 .

CLINICAL DATA: Abdominal bruit.

EXAM:
ULTRASOUND RETROPERITONEAL COMPLETE
TECHNIQUE: Ultrasound examination of the abdominal aorta was performed to
evaluate for abdominal aortic aneurysm. The common iliac arteries,
IVC, and kidneys were also evaluated.

[Series 1: us retroperitoneal complete · 0.19mm/px · 13 of 73 slices shown]
[im 1/73]
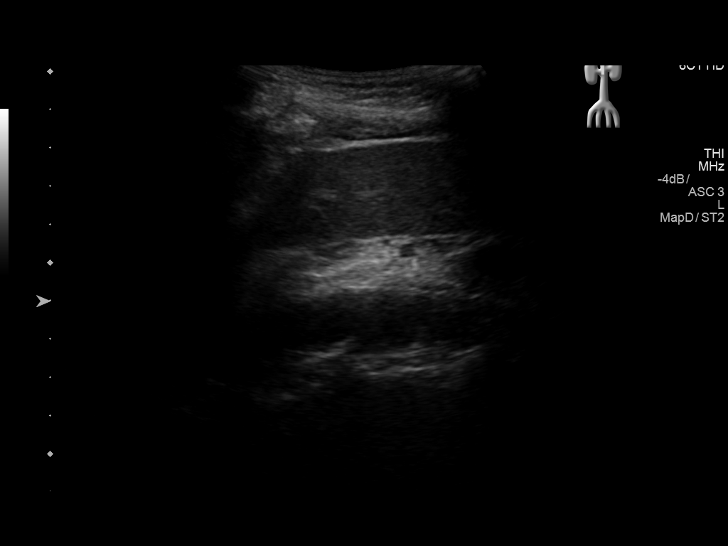
[im 7/73]
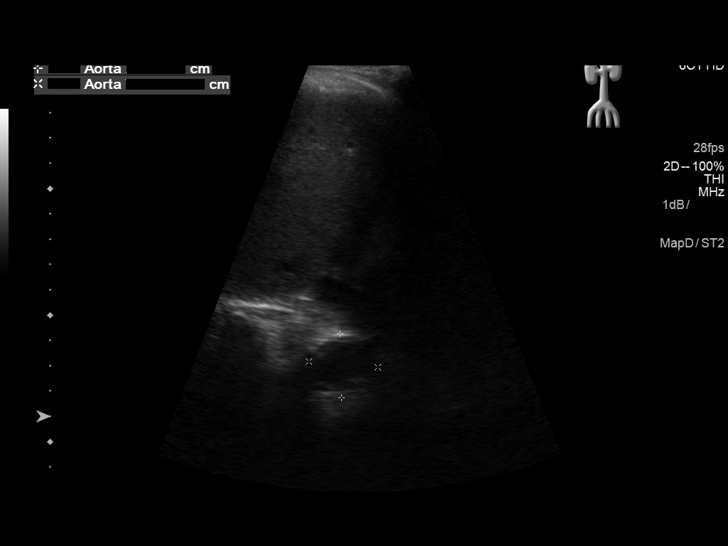
[im 13/73]
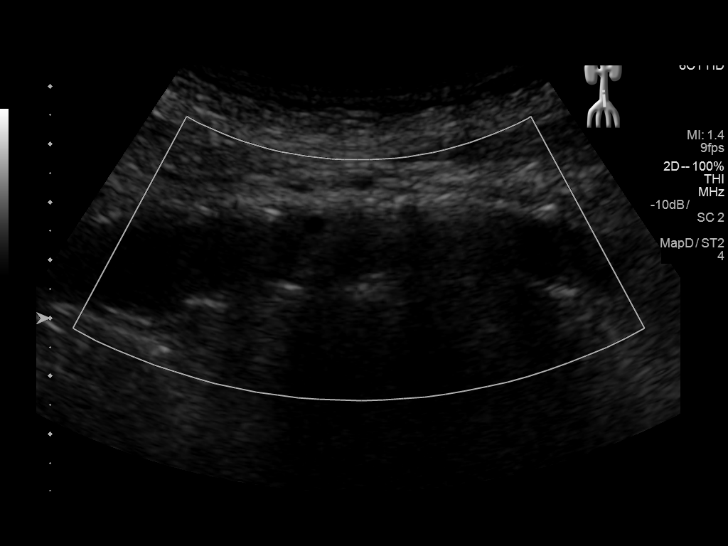
[im 19/73]
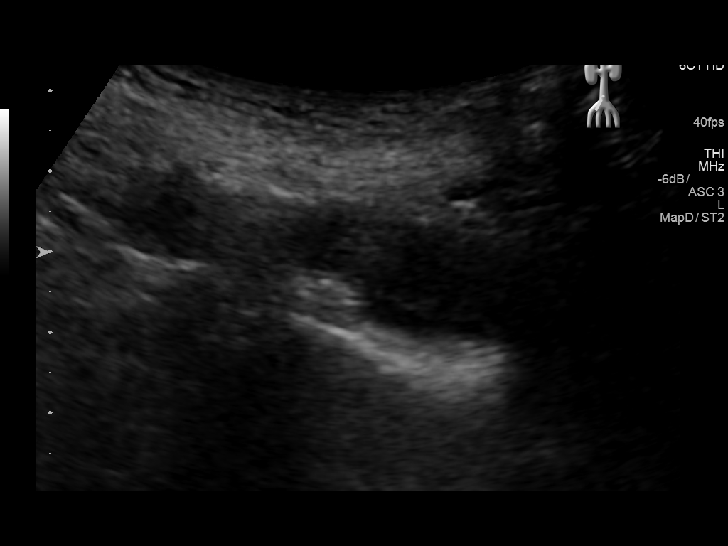
[im 25/73]
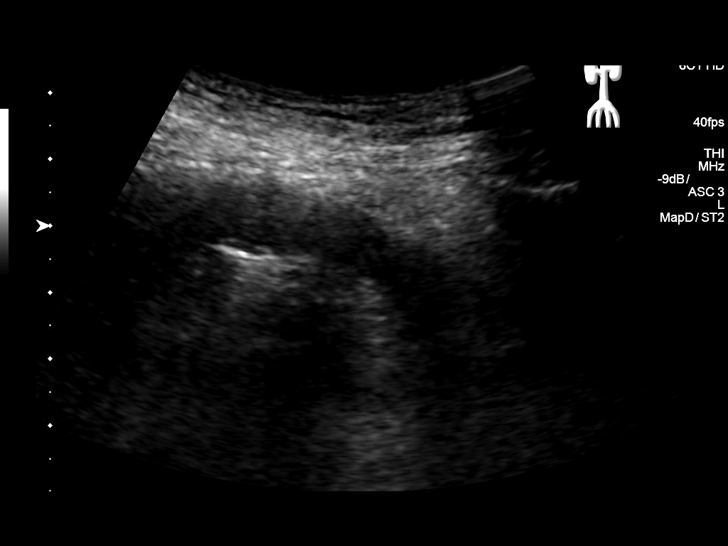
[im 31/73]
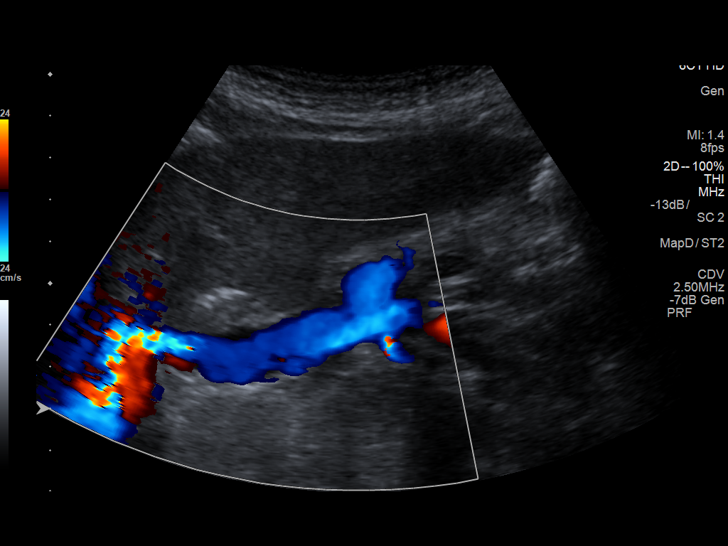
[im 37/73]
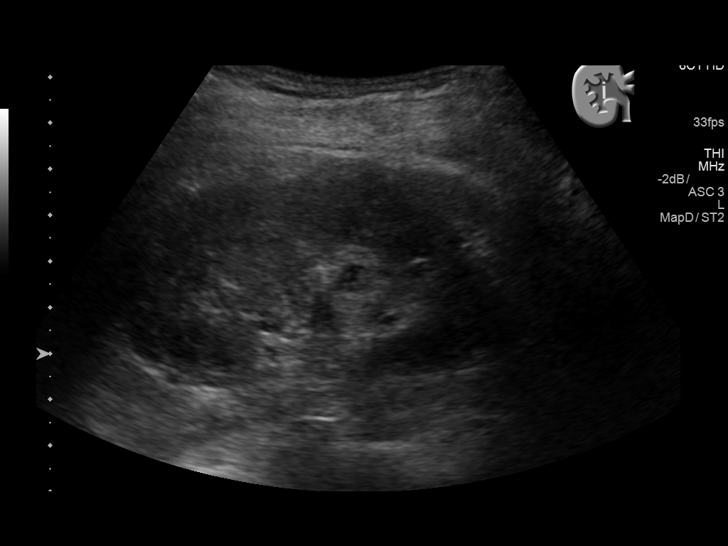
[im 43/73]
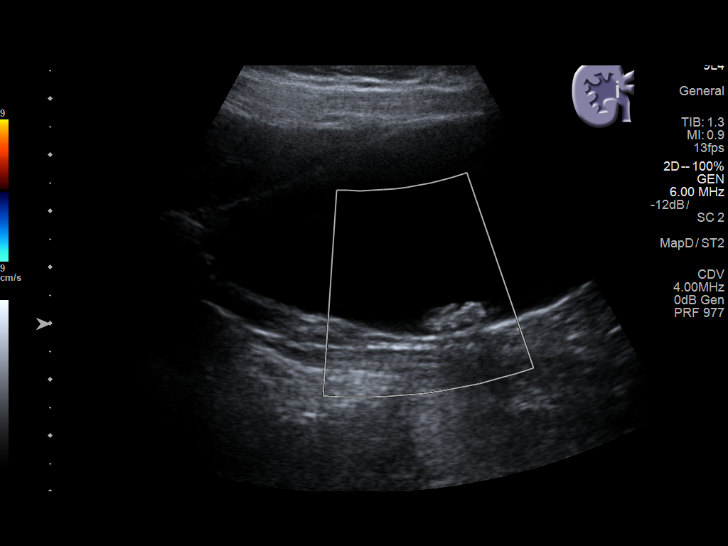
[im 49/73]
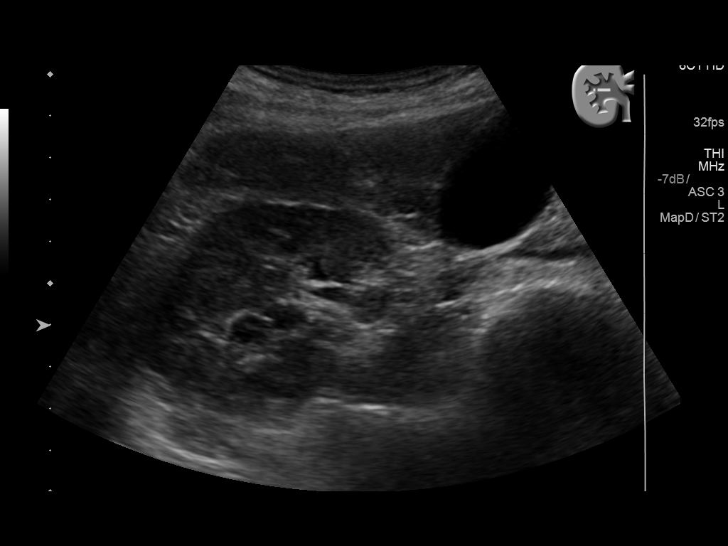
[im 55/73]
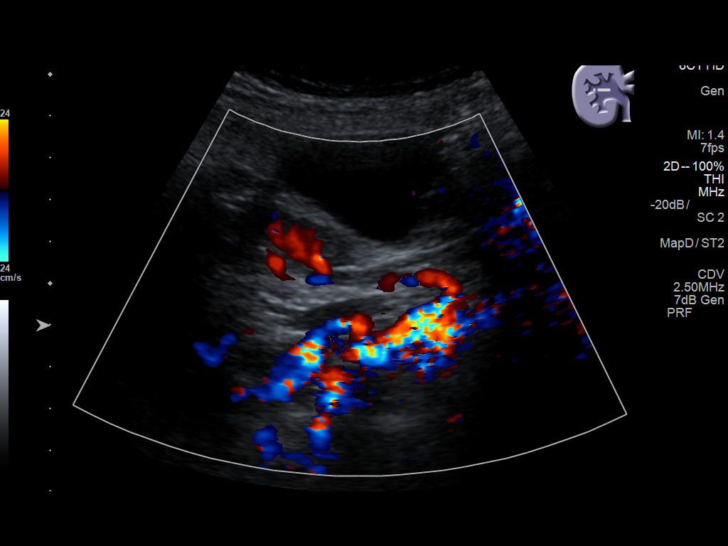
[im 61/73]
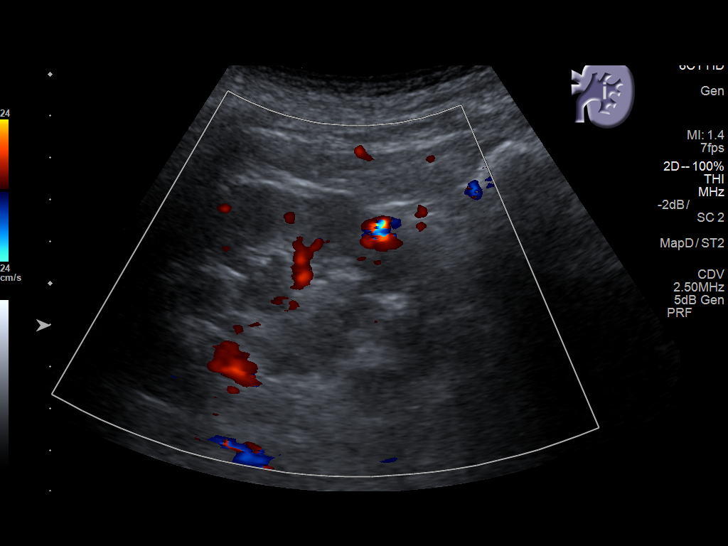
[im 67/73]
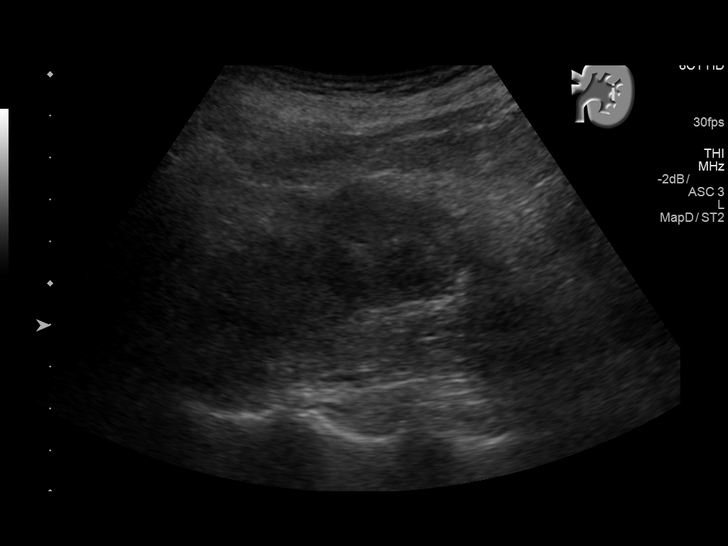
[im 73/73]
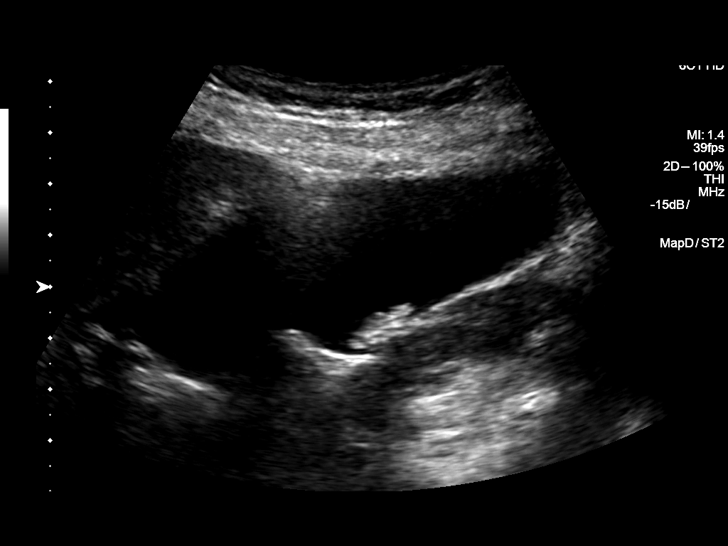

[13 of 25 positions shown; findings below may reference images not displayed]

FINDINGS: Abdominal Aorta

No aneurysm identified.

Maximum AP

Diameter:  2.5 cm

Maximum TRV

Diameter: 2.7 cm

Right Common Iliac Artery

1.2 cm

Left Common Iliac Artery

1.6 cm

IVC

No abnormality visualized.

Right Kidney

Length: 9.7 cm Echogenicity within normal limits. No mass. Mild
hydronephrosis cannot be excluded.

Left Kidney

Length: 8.5 cm Echogenicity within normal limits. No mass. Mild
hydronephrosis cannot be excluded.

Incidental note made of gallstones.
IMPRESSION: 1. Abdominal aortic ectasia 2.7 cm. Atherosclerotic vascular changes
are present. Ectatic abdominal aorta at risk for aneurysm
development. Recommend followup by ultrasound in 5 years. This
recommendation follows ACR consensus guidelines: White Paper of the
ACR Incidental Findings Committee II on Vascular Findings. [HOSPITAL] 9510; [DATE].

2.  Mild bilateral hydronephrosis cannot be excluded.

3.  Incidental note made of gallstones.

## 2016-10-25 ENCOUNTER — Telehealth: Payer: Self-pay | Admitting: *Deleted

## 2016-10-25 MED ORDER — CLONAZEPAM 0.5 MG PO TABS: ORAL_TABLET | ORAL | 1 refills | Status: DC

## 2016-10-25 NOTE — Telephone Encounter (Signed)
ok'd clonazepam #60 with one refill.   

## 2016-10-25 NOTE — Telephone Encounter (Signed)
faxed

## 2016-10-25 NOTE — Telephone Encounter (Signed)
Patient is not on lorazepam, she is taking klonopin 0.5mg  1 at bedtime and 1 every 8 hours as needed for anxiety and agitation. [facility] [place]

## 2016-10-25 NOTE — Telephone Encounter (Signed)
Recorded as historical provider on 06/14/16

## 2016-10-25 NOTE — Telephone Encounter (Signed)
Please call hospice nurse and clarify how she is taking the clonazepam.   Also confirm that she is off lorazepam.  Thanks.  Can do rx just need to clarify dose.

## 2016-10-25 NOTE — Telephone Encounter (Signed)
[name] from [facility] requested a mediation refill for [employer]onopin  Pharmacy [facility] [place]

## 2016-10-27 ENCOUNTER — Ambulatory Visit: Payer: Self-pay | Admitting: Internal Medicine

## 2016-11-03 ENCOUNTER — Telehealth: Payer: Self-pay | Admitting: Internal Medicine

## 2016-11-03 NOTE — Telephone Encounter (Signed)
Ok to check urinalysis and culture.  Please given them verbal ok.

## 2016-11-03 NOTE — Telephone Encounter (Signed)
Left detailed message.   

## 2016-11-03 NOTE — Telephone Encounter (Signed)
Stephanie from Hospice called and stated that she saw pt yesterday and was a little more combative than normal yesterday, and that she is running an axillary temperature of 100.4 and that her urine has a foul order. Can we put in an order for a Ua. Please advise, thank you!  Call [facility] - [phone]

## 2016-11-03 NOTE — Telephone Encounter (Signed)
Ok to order 

## 2016-11-08 ENCOUNTER — Telehealth: Payer: Self-pay | Admitting: *Deleted

## 2016-11-08 MED ORDER — CEFUROXIME AXETIL 250 MG PO TABS: 250.0000 mg | ORAL_TABLET | Freq: Two times a day (BID) | ORAL | 0 refills | Status: AC

## 2016-11-08 NOTE — Telephone Encounter (Signed)
pts urine returned positive for infection.  Sensitive to ceftin.  Will treat with ceftin 250mg  bid x 1 week.  Will need probiotic daily while on abx and for two weeks after complete abx.  Please notify hospice.  See attached.

## 2016-11-08 NOTE — Telephone Encounter (Signed)
Stephanie from hospice has requested a call once pt antibiotic is sent to the Pharmacy. Pt lab results are currently in Dr. Lorin PicketScott mailbox  Contact [name]  [phone] a message can be left on voicemail

## 2016-11-08 NOTE — Telephone Encounter (Signed)
LM on [employer]phanie voicemail of results and [facility] [place]

## 2016-11-10 NOTE — Telephone Encounter (Signed)
Noted  

## 2016-11-10 NOTE — Telephone Encounter (Signed)
FYI family has picked up the antibiotic and the medication has been started

## 2016-11-10 NOTE — Telephone Encounter (Signed)
[facility] and she is going to make sure that patient picked up RX.

## 2016-11-29 ENCOUNTER — Telehealth: Payer: Self-pay | Admitting: Internal Medicine

## 2016-11-29 MED ORDER — CLONAZEPAM 0.5 MG PO TABS: ORAL_TABLET | ORAL | 1 refills | Status: AC

## 2016-11-29 NOTE — Telephone Encounter (Signed)
rx ok'd for clonazepam #60 with one refill 

## 2016-11-29 NOTE — Telephone Encounter (Signed)
Stephanie from hospice is calling to have pt's clonazePAM (KLONOPIN) 0.5 MG tablet refilled. Her phone number is [phone].

## 2016-11-29 NOTE — Telephone Encounter (Signed)
Last fill 10/25/16

## 2016-11-29 NOTE — Telephone Encounter (Signed)
Hospice notified and script faxed as requested.

## 2016-12-11 ENCOUNTER — Other Ambulatory Visit: Payer: Self-pay | Admitting: Internal Medicine

## 2016-12-13 ENCOUNTER — Telehealth: Payer: Self-pay | Admitting: *Deleted

## 2016-12-13 NOTE — Telephone Encounter (Signed)
lmom for Tricia MccreedyBarbara, I don't think we receive the order report, Hospice needs to be re-fax

## 2016-12-13 NOTE — Telephone Encounter (Signed)
Britta [facility]ccreedyBarbara [facility] has requested a update on the physicians order report that was faxed over Fax [phone] Contact [phone]

## 2016-12-14 NOTE — Telephone Encounter (Signed)
Spoke with Britta MccreedyBarbara she will resend orders Please advise

## 2016-12-15 NOTE — Telephone Encounter (Signed)
Orders re-faxed. LM for [facility] orders signed and re-faxed.

## 2016-12-18 DIAGNOSIS — R6889 Other general symptoms and signs: Secondary | ICD-10-CM | POA: Diagnosis not present

## 2016-12-18 DIAGNOSIS — Z7401 Bed confinement status: Secondary | ICD-10-CM | POA: Diagnosis not present

## 2016-12-28 ENCOUNTER — Telehealth: Payer: Self-pay | Admitting: Internal Medicine

## 2016-12-28 NOTE — Telephone Encounter (Signed)
Tricia Ruiz regarding pt having congested cough. Lungs was clear no fever yesterday. Today pt is coughing and congested. Some crackling in bases mostly on the right. Pt has a fever today 99.1. No energy today. Pt ate this morning. Tylenol suppository.  She wants to know if Dr Lorin PicketScott would like to start a antibiotic liquid. Pt is allergic to levaquin. Please call [name] once called in.  [facility] [place] - [address]

## 2016-12-28 NOTE — Telephone Encounter (Signed)
Spoke with [employer] International CorporationStephanie.  Discussed concerns.  Increased congestion.  No pain.  Fever as outlined.  More lethargic.  Questioning possible aspiration.  Will treat with augmentin suspension 500 bid x 10 days.  Follow.  Notify me if any change or worsening problems.  Pt with Hospice.  Desire no transfer of care, etc.

## 2017-01-02 ENCOUNTER — Telehealth: Payer: Self-pay | Admitting: Internal Medicine

## 2017-01-02 NOTE — Telephone Encounter (Signed)
Pt is on Hospice care and cannot do the AWV. Thankyou!

## 2017-01-04 ENCOUNTER — Telehealth: Payer: Self-pay | Admitting: Internal Medicine

## 2017-01-04 NOTE — Telephone Encounter (Signed)
Spoke to [employer]rea (hospice).  Discussed current condition.  She is now on thickened liquids.  Doing some better.  Increased rhonchi in lungs.  On abx.  She is not in any acute distress or pain.  pts family desire no further intervention at this time.  They will follow and let me know if need anything more.

## 2017-01-04 NOTE — Telephone Encounter (Signed)
Stephanie from Hospice called with an update on the pt. States that pt is just not bouncing back she is still  lethargic, fevers, aspirating on liquids. Please advise, thank you!  Stephanie @ [phone]

## 2017-01-04 NOTE — Telephone Encounter (Signed)
pls advise

## 2017-01-06 ENCOUNTER — Telehealth: Payer: Self-pay | Admitting: Internal Medicine

## 2017-01-06 NOTE — Telephone Encounter (Signed)
Crystal [phone] called from [facility] pt is not doing well right now. Pt does not have morphine in her comfort kit and she wants to know if Dr Nicki Reaper can send a Rx for liquid morphine?  Pharmacy is [place], [place]. Thank you!

## 2017-01-06 NOTE — Telephone Encounter (Signed)
Verified with pharmacy that it was received

## 2017-01-06 NOTE — Telephone Encounter (Signed)
Please advise 

## 2017-01-06 NOTE — Telephone Encounter (Signed)
Script has been faxed called pharmacy and let them know

## 2017-01-06 NOTE — Telephone Encounter (Signed)
rx written and placed in box.  Family aware and desires morphine to be on hand.  Pt does have listed allergy.  Family aware and in agreement.

## 2017-01-11 ENCOUNTER — Telehealth: Payer: Self-pay | Admitting: *Deleted

## 2017-01-12 NOTE — Telephone Encounter (Signed)
Changed in patient chart

## 2017-01-12 NOTE — Telephone Encounter (Signed)
Do I need to pass information to someone in our office?

## 2017-01-12 NOTE — Telephone Encounter (Signed)
I think Erie NoeVanessa is who we notify.  She will change her chart status.  Thanks

## 2017-01-13 ENCOUNTER — Telehealth: Payer: Self-pay | Admitting: Internal Medicine

## 2017-01-13 NOTE — Telephone Encounter (Signed)
[employer] dropped off death certificate for Dr. Lorin PicketScott to sign. Death Certificate is up front in Dr. Roby LoftsScott's color folder.

## 2017-01-13 NOTE — Telephone Encounter (Signed)
In your blue folder.  

## 2017-01-16 NOTE — Telephone Encounter (Signed)
Please contact [place] [facility] when certificate has been signed  [place] [facility] [phone]

## 2017-01-16 NOTE — Telephone Encounter (Signed)
Called let them know ready for pick up copy in scan

## 2017-01-19 NOTE — Telephone Encounter (Signed)
FYI [place] of Hospice, reported patient's death at [id number].

## 2017-01-19 DEATH — deceased

## 2017-01-23 ENCOUNTER — Telehealth: Payer: Self-pay | Admitting: Internal Medicine

## 2017-01-23 NOTE — Telephone Encounter (Signed)
[name] called and was looking for signed interim orders that were faxed over on 2/13. Please advise, thank you!  Call @ [phone]

## 2017-01-23 NOTE — Telephone Encounter (Signed)
Orders refaxed.

## 2017-06-30 IMAGING — CT CT RENAL STONE PROTOCOL
2 of 3 series · 16 of 46 positions shown, 18 images · non-contrast
Comparison: None.

CLINICAL DATA: [AGE] female with flank pain and hematuria.

EXAM:
CT ABDOMEN AND PELVIS WITHOUT CONTRAST
TECHNIQUE: Multidetector CT imaging of the abdomen and pelvis was performed
following the standard protocol without IV contrast.

[Series 2: axial st · axial · 0.67mm/px · z∈[-910,-560]mm · 13 of 82 slices shown, 15 images]
[im 6/82  soft-tissue]
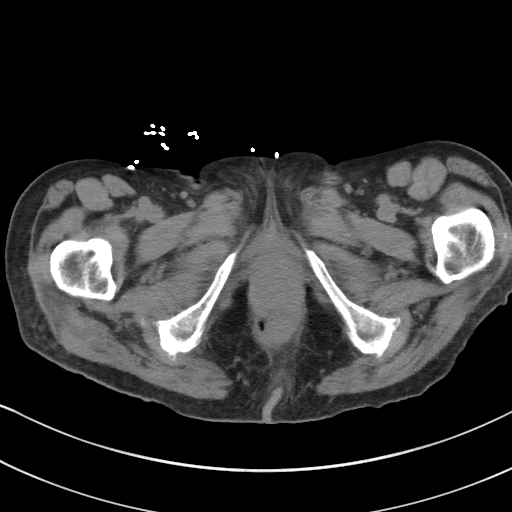
[im 6/82  bone]
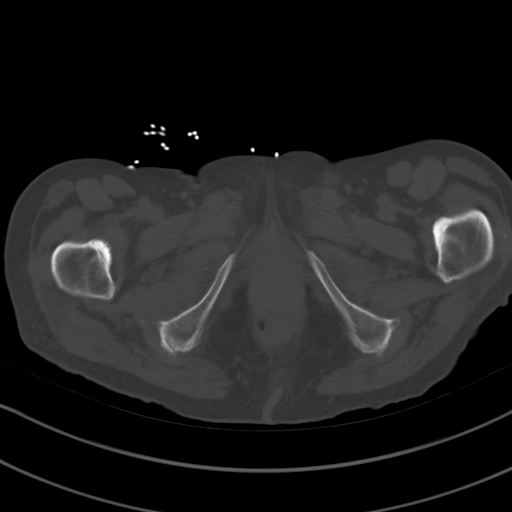
[im 11/82  soft-tissue]
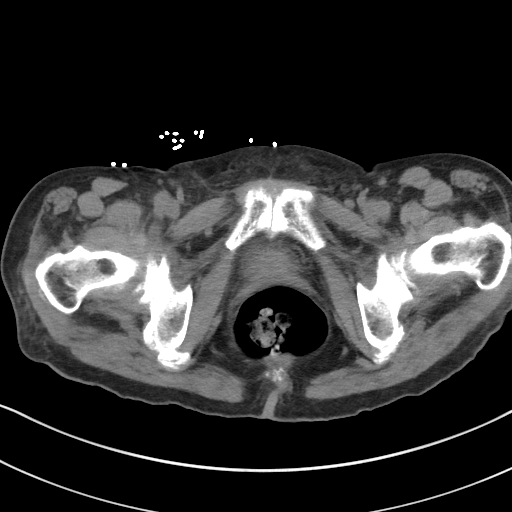
[im 16/82  soft-tissue]
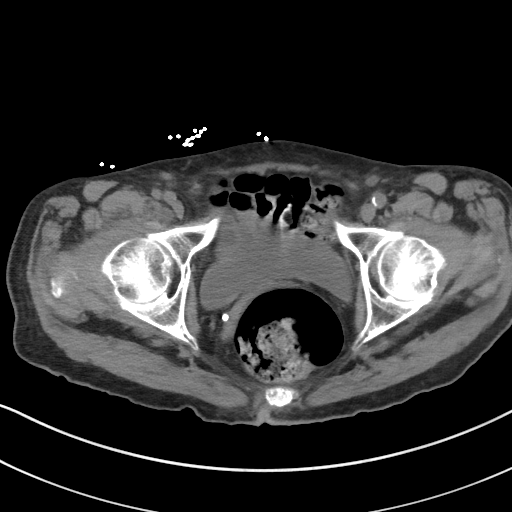
[im 24/82  soft-tissue]
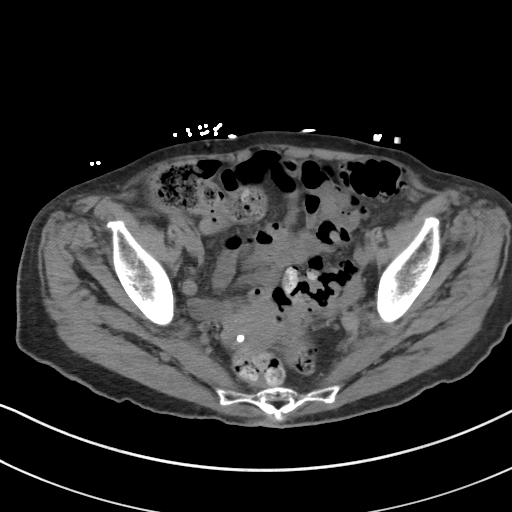
[im 29/82  soft-tissue]
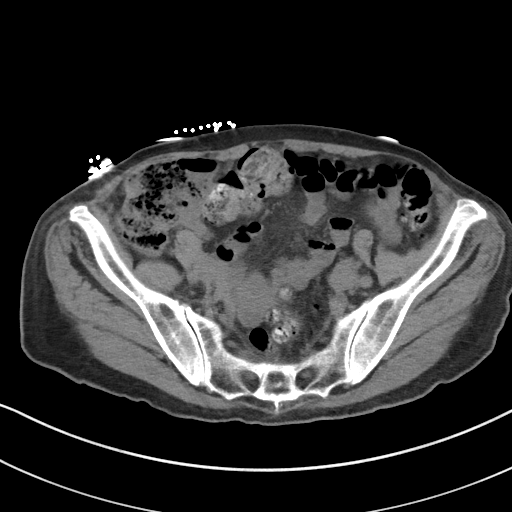
[im 34/82  soft-tissue]
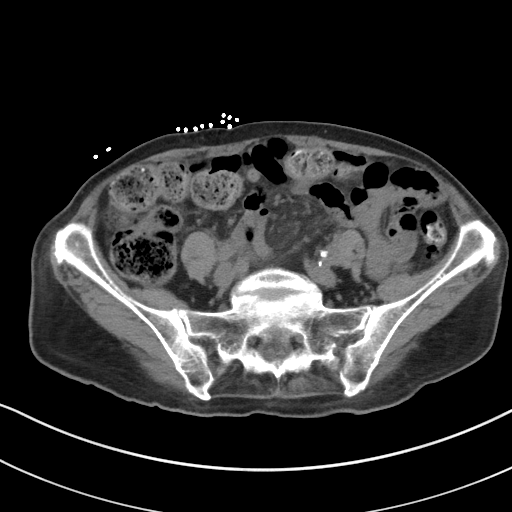
[im 42/82  soft-tissue]
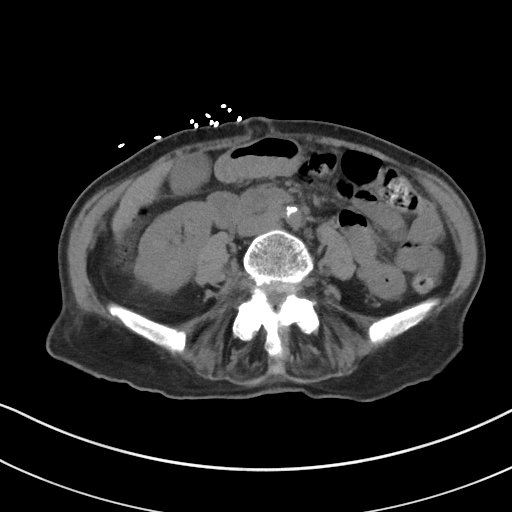
[im 48/82  soft-tissue]
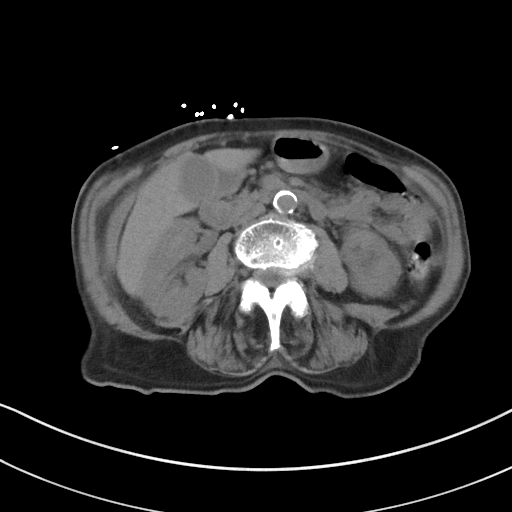
[im 53/82  soft-tissue]
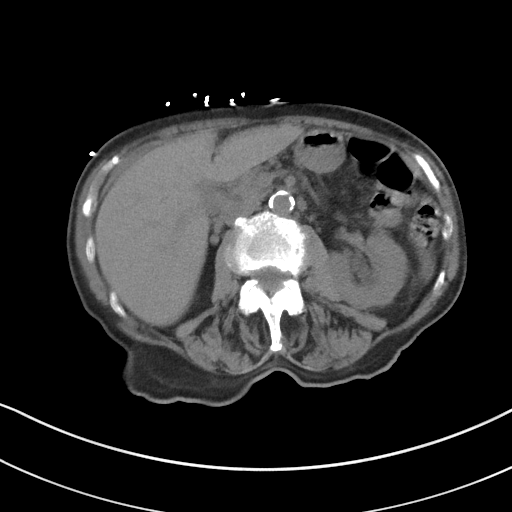
[im 53/82  bone]
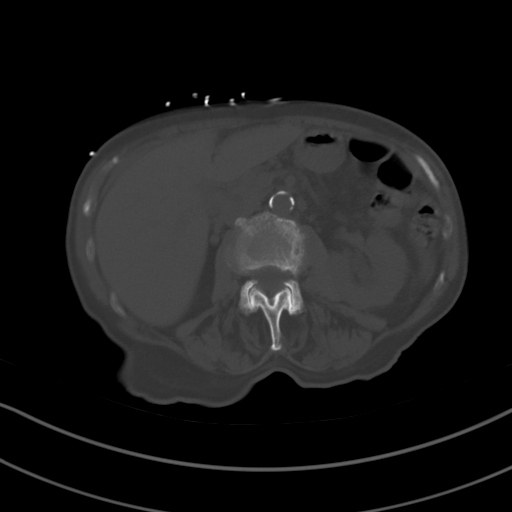
[im 58/82  soft-tissue]
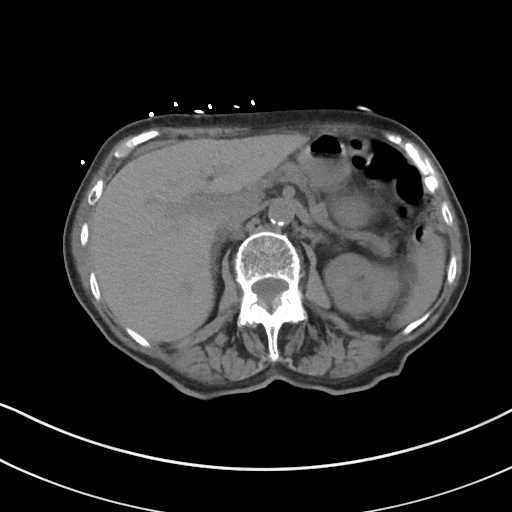
[im 66/82  soft-tissue]
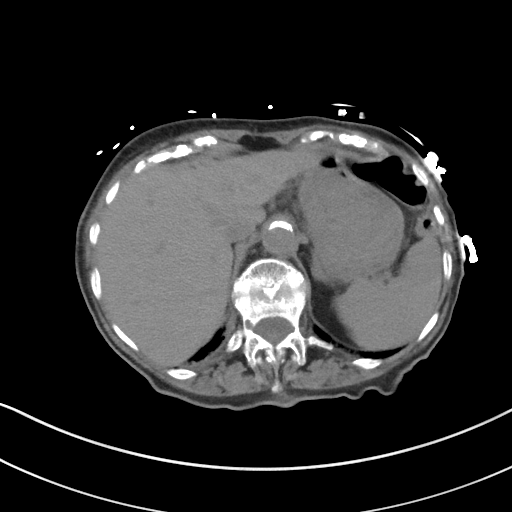
[im 71/82  soft-tissue]
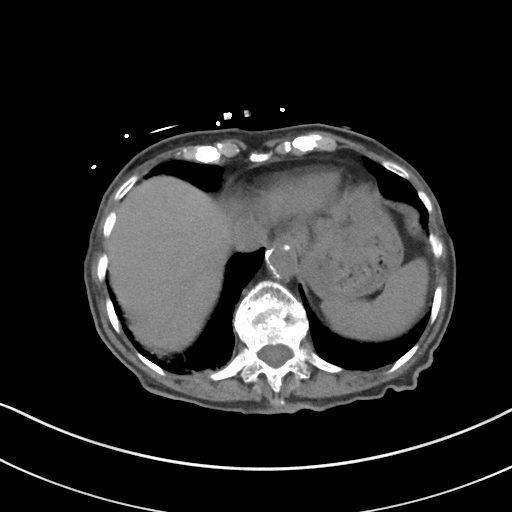
[im 76/82  soft-tissue]
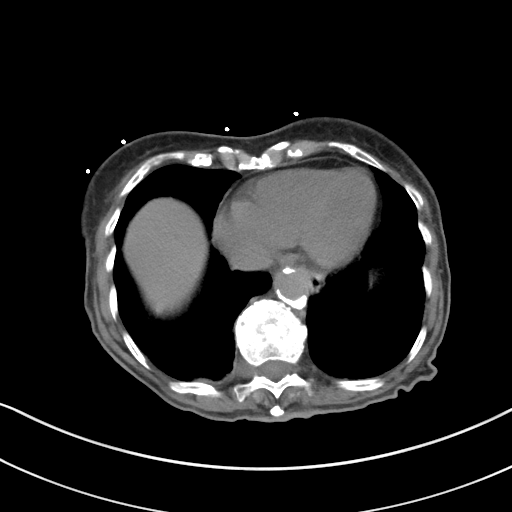

[Series 5: coronal · coronal · 0.69mm/px · 3 of 109 slices shown]
[im 37/109  soft-tissue]
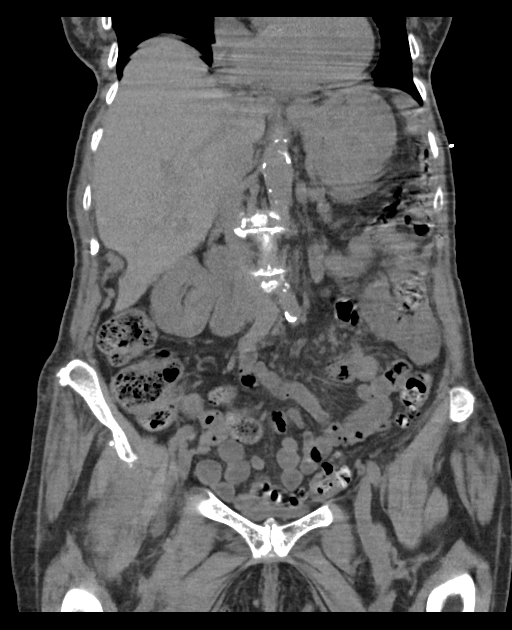
[im 49/109  soft-tissue]
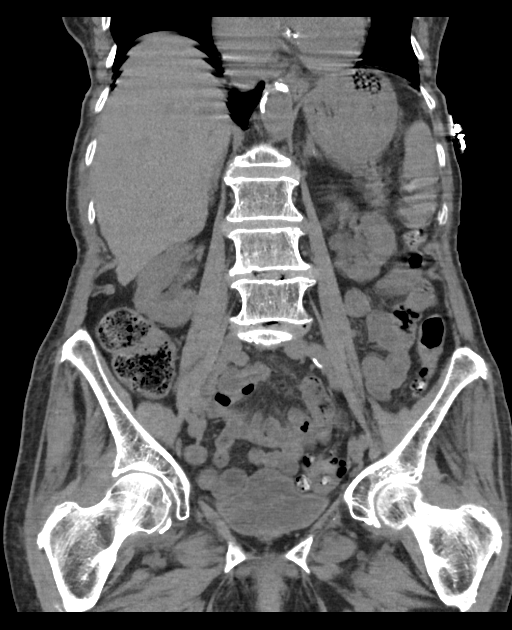
[im 61/109  soft-tissue]
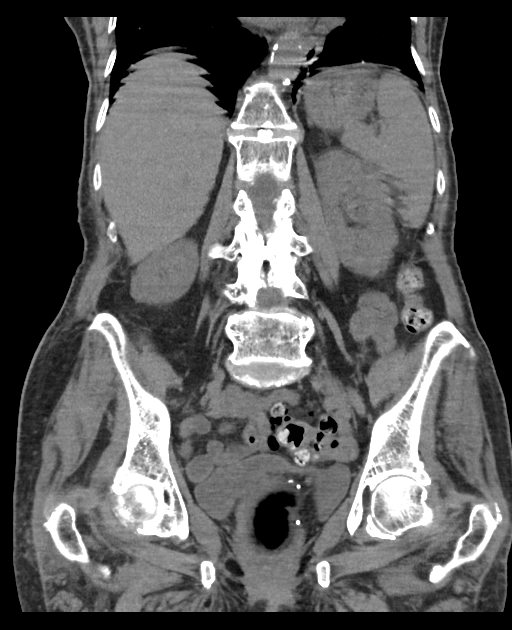

[16 of 46 positions shown; findings below may reference images not displayed]

FINDINGS: Evaluation of this exam is limited in the absence of intravenous
contrast. Evaluation is also limited due to respiratory motion
artifact.

The visualized lung bases are clear. There is coronary vascular
calcification. There is hypoattenuation of the cardiac blood pool
suggestive of a degree of anemia. Clinical correlation is
recommended.

No intra-abdominal free air or free fluid.

There multiple stones within the gallbladder. No pericholecystic
fluid. The liver appears unremarkable. The pancreas is atrophic. The
spleen, and adrenal glands appear unremarkable. There is mild
fullness of the renal collecting systems bilaterally, left greater
right without frank hydronephrosis. No stone identified. The
visualized ureters and urinary bladder appear unremarkable. Small
calcified uterine fibroids noted.

There is extensive sigmoid diverticulosis without active
inflammatory changes. Constipation. No bowel obstruction or active
inflammation. The The appendix is not visualized with certainty. No
inflammatory changes identified in the right lower quadrant.

There is advanced aortoiliac atherosclerotic disease. She no portal
venous gas identified. There is no adenopathy. There is a small fat
containing umbilical hernia. There is osteopenia with degenerative
changes of the spine. Multilevel disc desiccation with vacuum
phenomena. Grade 1 L4-L5 anterolisthesis. There is old-appearing T11
compression deformity with mild anterior wedging. No acute fracture.
IMPRESSION: Minimal fullness of the renal collecting systems without frank
hydronephrosis. No stone identified.

Cholelithiasis.

Extensive sigmoid and colonic diverticulosis without active
inflammatory changes. Constipation. No bowel obstruction.

## 2017-06-30 IMAGING — CR DG CHEST 2V
2 series · 2 of 2 positions shown · non-contrast
Comparison: 06/07/2011

CLINICAL DATA: Weakness

EXAM:
CHEST  2 VIEW

[chest lat]
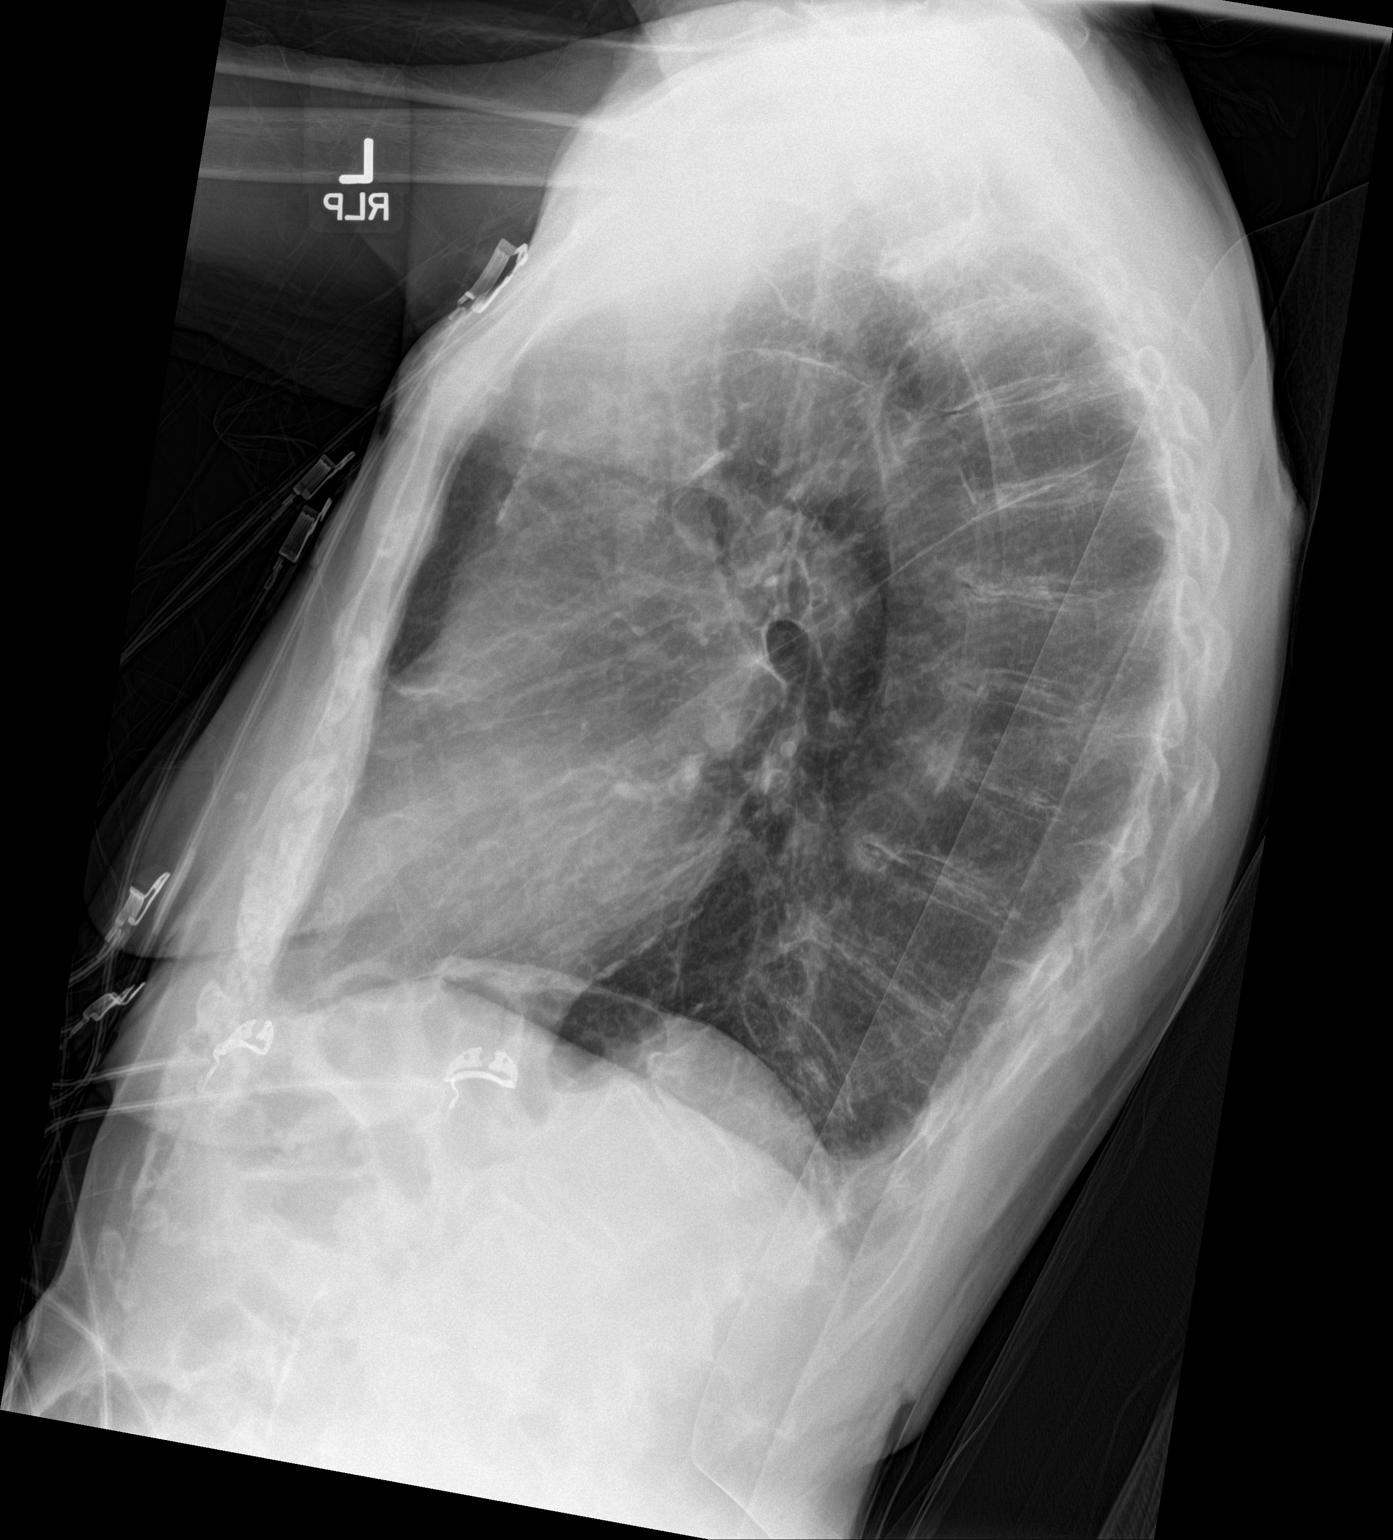

[chest ap]
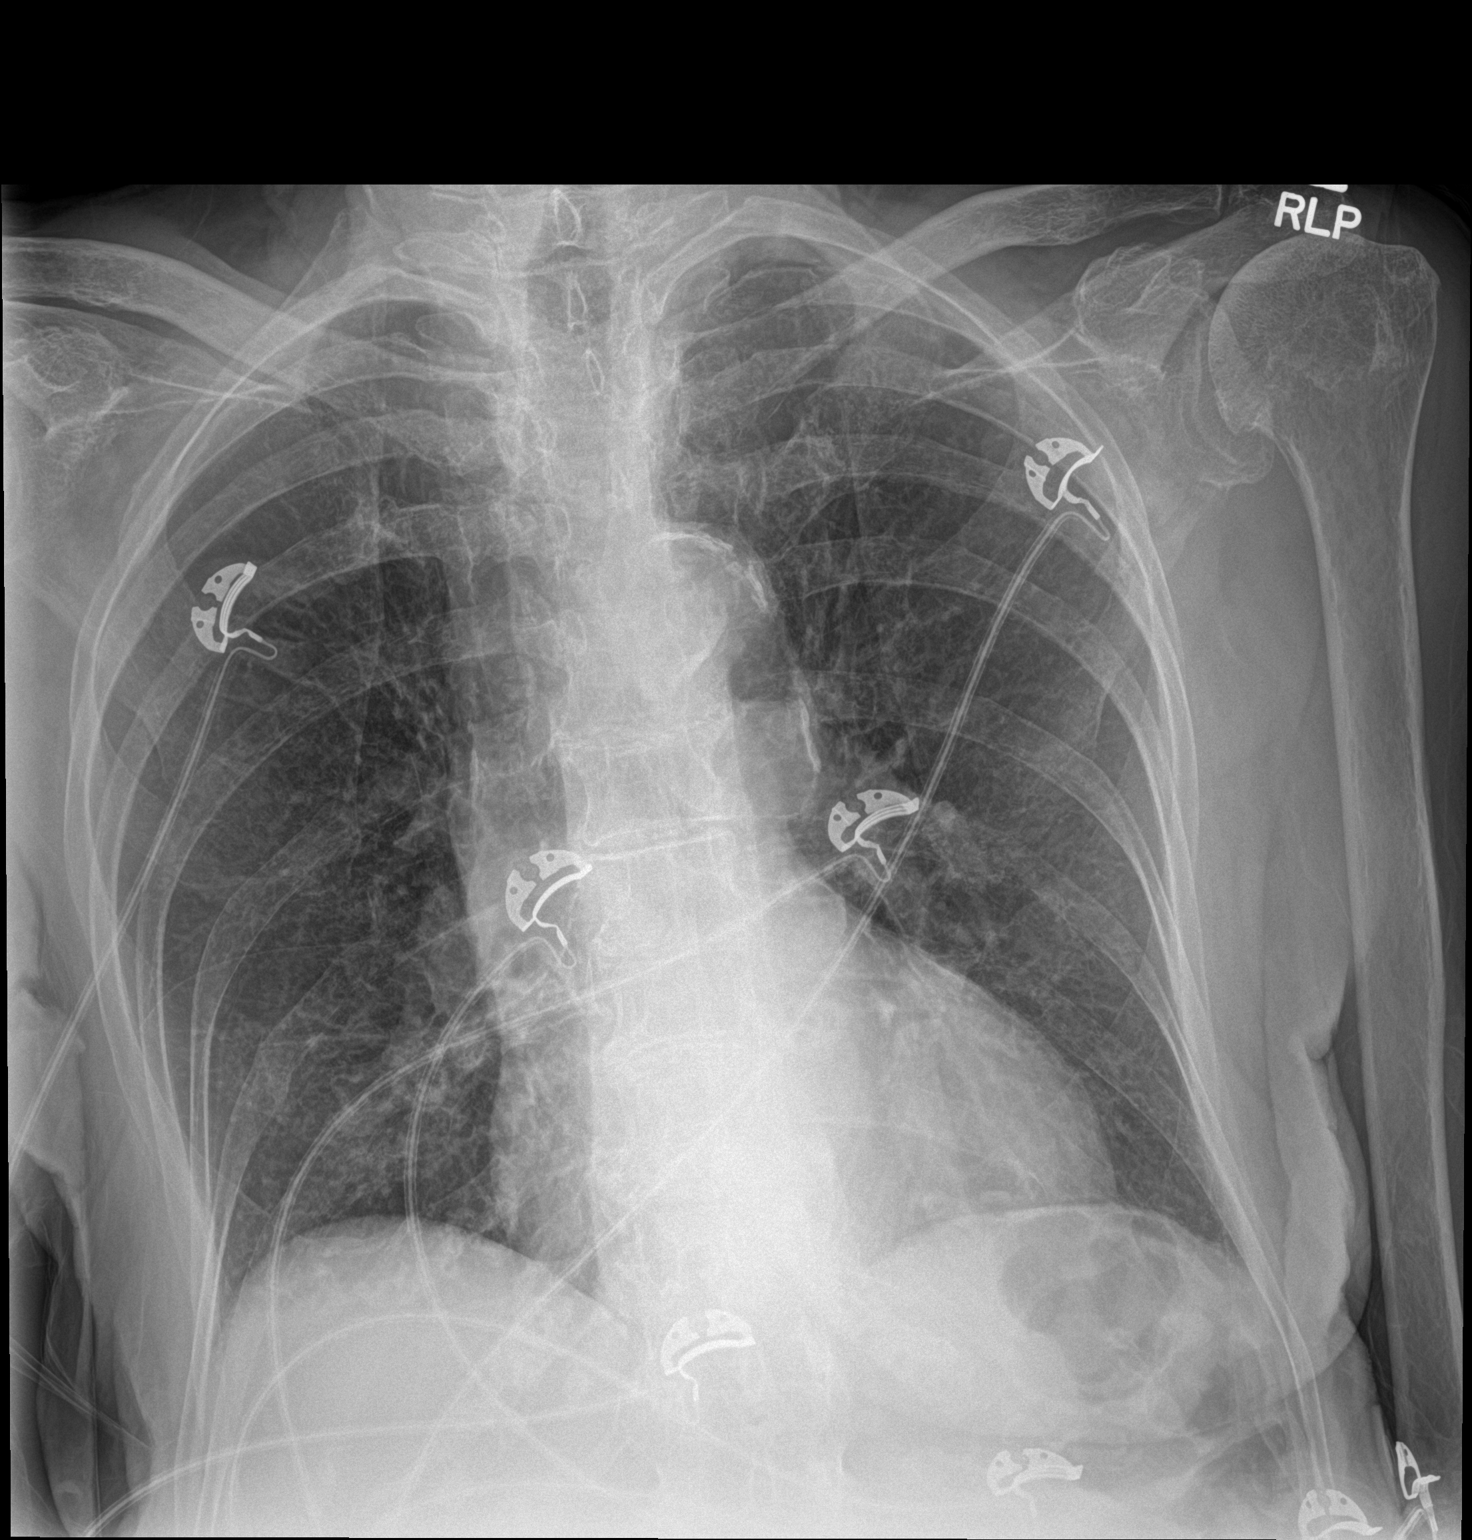

[2 of 2 positions shown; findings below may reference images not displayed]

FINDINGS: Cardiac shadow is stable at the upper limits of normal in size.
Aortic calcifications are again seen. The lungs are well aerated
bilaterally. No focal infiltrate or sizable effusion is seen.
Degenerative changes of the thoracic spine are noted.
IMPRESSION: No acute abnormality noted.
